# Patient Record
Sex: Male | Born: 1970 | Race: White | Hispanic: No | Marital: Married | Smoking: Former smoker
Health system: Southern US, Community
[De-identification: ages and names within clinical notes are randomized; demographics above are authoritative.]

## PROBLEM LIST (undated history)

## (undated) DIAGNOSIS — K219 Gastro-esophageal reflux disease without esophagitis: Secondary | ICD-10-CM

## (undated) DIAGNOSIS — N200 Calculus of kidney: Secondary | ICD-10-CM

## (undated) DIAGNOSIS — M898X9 Other specified disorders of bone, unspecified site: Secondary | ICD-10-CM

## (undated) DIAGNOSIS — R519 Headache, unspecified: Secondary | ICD-10-CM

## (undated) DIAGNOSIS — M5412 Radiculopathy, cervical region: Secondary | ICD-10-CM

## (undated) DIAGNOSIS — E119 Type 2 diabetes mellitus without complications: Secondary | ICD-10-CM

## (undated) DIAGNOSIS — R51 Headache: Secondary | ICD-10-CM

## (undated) DIAGNOSIS — M899 Disorder of bone, unspecified: Secondary | ICD-10-CM

## (undated) DIAGNOSIS — C9000 Multiple myeloma not having achieved remission: Secondary | ICD-10-CM

## (undated) HISTORY — DX: Disorder of bone, unspecified: M89.9

## (undated) HISTORY — PX: OTHER SURGICAL HISTORY: SHX169

## (undated) HISTORY — PX: BACK SURGERY: SHX140

## (undated) HISTORY — PX: EYE SURGERY: SHX253

## (undated) HISTORY — DX: Other specified disorders of bone, unspecified site: M89.8X9

## (undated) HISTORY — DX: Multiple myeloma not having achieved remission: C90.00

## (undated) HISTORY — PX: VASECTOMY: SHX75

## (undated) HISTORY — PX: ROTATOR CUFF REPAIR: SHX139

## (undated) HISTORY — DX: Radiculopathy, cervical region: M54.12

---

## 2003-02-20 ENCOUNTER — Emergency Department (HOSPITAL_COMMUNITY): Admission: AC | Admit: 2003-02-20 | Discharge: 2003-02-20 | Payer: Self-pay

## 2012-02-04 ENCOUNTER — Other Ambulatory Visit: Payer: Self-pay | Admitting: Neurosurgery

## 2012-02-04 DIAGNOSIS — M549 Dorsalgia, unspecified: Secondary | ICD-10-CM

## 2012-02-04 DIAGNOSIS — M5410 Radiculopathy, site unspecified: Secondary | ICD-10-CM

## 2012-02-09 ENCOUNTER — Ambulatory Visit
Admission: RE | Admit: 2012-02-09 | Discharge: 2012-02-09 | Disposition: A | Discharge status: Home / Self Care | Payer: BC Managed Care – PPO | Source: Ambulatory Visit | Attending: Neurosurgery | Admitting: Neurosurgery

## 2012-02-09 VITALS — BP 126/76 | HR 61 | Ht 72.0 in | Wt 205.0 lb

## 2012-02-09 DIAGNOSIS — M549 Dorsalgia, unspecified: Secondary | ICD-10-CM

## 2012-02-09 DIAGNOSIS — M5410 Radiculopathy, site unspecified: Secondary | ICD-10-CM

## 2012-02-09 MED ORDER — DIAZEPAM 5 MG PO TABS
10.0000 mg | ORAL_TABLET | Freq: Once | ORAL | Status: AC
  Administered 2012-02-09: 10 mg via ORAL

## 2012-02-09 MED ORDER — IOHEXOL 180 MG/ML  SOLN
15.0000 mL | Freq: Once | INTRAMUSCULAR | Status: AC | PRN
  Administered 2012-02-09: 15 mL via INTRATHECAL

## 2012-02-09 NOTE — Progress Notes (Signed)
Patient resting quietly on stomach on stretcher in nursing station with wife at bedside.  Denies any pain at present.  jkl

## 2012-02-26 ENCOUNTER — Other Ambulatory Visit: Payer: Self-pay | Admitting: Neurosurgery

## 2012-04-20 ENCOUNTER — Encounter (HOSPITAL_COMMUNITY)
Admission: RE | Admit: 2012-04-20 | Discharge: 2012-04-20 | Disposition: A | Discharge status: Home / Self Care | Payer: BC Managed Care – PPO | Source: Ambulatory Visit | Attending: Neurosurgery | Admitting: Neurosurgery

## 2012-04-20 ENCOUNTER — Encounter (HOSPITAL_COMMUNITY)
Admission: RE | Admit: 2012-04-20 | Discharge: 2012-04-20 | Disposition: A | Discharge status: Home / Self Care | Payer: BC Managed Care – PPO | Source: Ambulatory Visit | Attending: Anesthesiology | Admitting: Anesthesiology

## 2012-04-20 ENCOUNTER — Encounter (HOSPITAL_COMMUNITY): Payer: Self-pay | Admitting: Pharmacy Technician

## 2012-04-20 ENCOUNTER — Encounter (HOSPITAL_COMMUNITY): Payer: Self-pay

## 2012-04-20 HISTORY — DX: Calculus of kidney: N20.0

## 2012-04-20 HISTORY — DX: Gastro-esophageal reflux disease without esophagitis: K21.9

## 2012-04-20 HISTORY — DX: Type 2 diabetes mellitus without complications: E11.9

## 2012-04-20 LAB — BASIC METABOLIC PANEL
BUN: 14 mg/dL (ref 6–23)
CO2: 27 mEq/L (ref 19–32)
Calcium: 9.8 mg/dL (ref 8.4–10.5)
Creatinine, Ser: 0.84 mg/dL (ref 0.50–1.35)
GFR calc non Af Amer: >90 mL/min (ref >90)
Glucose, Bld: 101 mg/dL — ABNORMAL HIGH (ref 70–99)
Sodium: 137 mEq/L (ref 135–145)

## 2012-04-20 LAB — CBC
MCH: 29.4 pg (ref 26.0–34.0)
MCHC: 34.8 g/dL (ref 30.0–36.0)
MCV: 84.5 fL (ref 78.0–100.0)
Platelets: 239 10*3/uL (ref 150–400)
RDW: 13.3 % (ref 11.5–15.5)

## 2012-04-20 LAB — TYPE AND SCREEN: ABO/RH(D): B POS

## 2012-04-20 LAB — SURGICAL PCR SCREEN: MRSA, PCR: NEGATIVE

## 2012-04-20 NOTE — Pre-Procedure Instructions (Signed)
[address]  04/20/2012   Your procedure is scheduled on:  Tuesday, December 3rd.  Report to [facility] at 5 :30 AM.  Call this number if you have problems the morning of surgery: [phone]   Remember:                              Nothing to eat or drink after Midnight.    Take these medicines the morning of surgery with A SIP OF WATER: Gabapentin (Neurotin).  May take Oxycodone (Roxicodone) if needed.   Stop taking Aspirin and Meloxicam on November 26th     Do not .wear jewelry, make-up or nail polish.  Do not wear lotions, powders, or perfumes. You may wear deodorant.  Do not shave 48 hours prior to surgery. Men may shave face and neck.  Do not bring valuables to the hospital.  Contacts, dentures or bridgework may not be worn into surgery.  Leave suitcase in the car. After surgery it may be brought to your room.  For patients admitted to the hospital, checkout time is 11:00 AM the day of discharge.   Patients discharged the day of surgery will not be allowed to drive home.  Name and phone number of your driver: NA    Special Instructions: Shower using CHG 2 nights before surgery and the night before surgery.  If you shower the day of surgery use CHG.  Use special wash - you have one bottle of CHG for all showers.  You should use approximately 1/3 of the bottle for each shower.     Please read over the following fact sheets that you were given: Pain Booklet, Coughing and Deep Breathing, Blood Transfusion Information and Surgical Site Infection Prevention
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
## 2012-04-22 NOTE — Consult Note (Signed)
Anesthesia chart review: Patient is a 41 year old male posted for L2-3, L3-4 posterior lumbar fusion on 05/04/12 by Dr. Jeral Fruit.  History includes former smoker, GERD, diabetes mellitus type 2, kidney stones.  EKG from 04/20/2012 showed normal sinus rhythm, cannot rule out anterior infarct, age undetermined. Currently there no comparison EKGs available.  Chest x-ray on 04/20/2012 showed no acute cardiopulmonary abnormalities.  Preoperative labs noted.  No CV symptoms were documented at his PAT appointment. Anticipate that if he remains asymptomatic from a CV standpoint then he can proceed as planned.  Shonna Chock, PA-C

## 2012-04-23 NOTE — Progress Notes (Signed)
Message left with Shanda Bumps Dr Cassandria Santee scheduler to inform Dr Jeral Fruit of need for surgical orders.

## 2012-05-03 NOTE — Progress Notes (Signed)
Called and left message with Shanda Bumps at Dr. Cassandria Santee office and request that he sign orders.

## 2012-05-04 ENCOUNTER — Inpatient Hospital Stay (HOSPITAL_COMMUNITY)
Admission: RE | Admit: 2012-05-04 | Discharge: 2012-05-07 | DRG: 756 | Disposition: A | Discharge status: Home / Self Care | Payer: BC Managed Care – PPO | Source: Ambulatory Visit | Attending: Neurosurgery | Admitting: Neurosurgery

## 2012-05-04 ENCOUNTER — Encounter (HOSPITAL_COMMUNITY)
Admission: RE | Disposition: A | Discharge status: Home / Self Care | Payer: Self-pay | Source: Ambulatory Visit | Attending: Neurosurgery

## 2012-05-04 ENCOUNTER — Ambulatory Visit (HOSPITAL_COMMUNITY): Payer: BC Managed Care – PPO

## 2012-05-04 ENCOUNTER — Encounter (HOSPITAL_COMMUNITY): Payer: Self-pay | Admitting: Vascular Surgery

## 2012-05-04 ENCOUNTER — Ambulatory Visit (HOSPITAL_COMMUNITY): Payer: BC Managed Care – PPO | Admitting: Vascular Surgery

## 2012-05-04 DIAGNOSIS — Z01810 Encounter for preprocedural cardiovascular examination: Secondary | ICD-10-CM

## 2012-05-04 DIAGNOSIS — Z01811 Encounter for preprocedural respiratory examination: Secondary | ICD-10-CM

## 2012-05-04 DIAGNOSIS — Z01812 Encounter for preprocedural laboratory examination: Secondary | ICD-10-CM

## 2012-05-04 DIAGNOSIS — Z7982 Long term (current) use of aspirin: Secondary | ICD-10-CM

## 2012-05-04 DIAGNOSIS — M48061 Spinal stenosis, lumbar region without neurogenic claudication: Principal | ICD-10-CM | POA: Diagnosis present

## 2012-05-04 DIAGNOSIS — E119 Type 2 diabetes mellitus without complications: Secondary | ICD-10-CM | POA: Diagnosis present

## 2012-05-04 DIAGNOSIS — Z79899 Other long term (current) drug therapy: Secondary | ICD-10-CM

## 2012-05-04 DIAGNOSIS — K219 Gastro-esophageal reflux disease without esophagitis: Secondary | ICD-10-CM | POA: Diagnosis present

## 2012-05-04 DIAGNOSIS — Z87891 Personal history of nicotine dependence: Secondary | ICD-10-CM

## 2012-05-04 HISTORY — PX: LUMBAR FUSION: SHX111

## 2012-05-04 LAB — GLUCOSE, CAPILLARY
Glucose-Capillary: 113 mg/dL — ABNORMAL HIGH (ref 70–99)
Glucose-Capillary: 119 mg/dL — ABNORMAL HIGH (ref 70–99)

## 2012-05-04 SURGERY — POSTERIOR LUMBAR FUSION 2 LEVEL
Anesthesia: General | Site: Back | Wound class: Clean

## 2012-05-04 MED ORDER — CEFAZOLIN SODIUM-DEXTROSE 2-3 GM-% IV SOLR
INTRAVENOUS | Status: AC
  Filled 2012-05-04: qty 50

## 2012-05-04 MED ORDER — ACETAMINOPHEN 325 MG PO TABS
650.0000 mg | ORAL_TABLET | ORAL | Status: DC | PRN
  Administered 2012-05-06: 650 mg via ORAL
  Filled 2012-05-04: qty 2

## 2012-05-04 MED ORDER — LIDOCAINE HCL 4 % MT SOLN
OROMUCOSAL | Status: DC | PRN
  Administered 2012-05-04: 4 mL via TOPICAL

## 2012-05-04 MED ORDER — NALOXONE HCL 0.4 MG/ML IJ SOLN: 0.4000 mg | INTRAMUSCULAR | Status: DC | PRN

## 2012-05-04 MED ORDER — DIAZEPAM 5 MG PO TABS
5.0000 mg | ORAL_TABLET | Freq: Four times a day (QID) | ORAL | Status: DC | PRN
  Administered 2012-05-04 – 2012-05-06 (×7): 5 mg via ORAL
  Filled 2012-05-04 (×7): qty 1

## 2012-05-04 MED ORDER — DOCUSATE SODIUM 100 MG PO CAPS
100.0000 mg | ORAL_CAPSULE | Freq: Two times a day (BID) | ORAL | Status: DC
  Administered 2012-05-04 – 2012-05-07 (×6): 100 mg via ORAL
  Filled 2012-05-04 (×6): qty 1

## 2012-05-04 MED ORDER — 0.9 % SODIUM CHLORIDE (POUR BTL) OPTIME
TOPICAL | Status: DC | PRN
  Administered 2012-05-04: 1000 mL

## 2012-05-04 MED ORDER — SODIUM CHLORIDE 0.9 % IV SOLN: 250.0000 mL | INTRAVENOUS | Status: DC

## 2012-05-04 MED ORDER — GLYCOPYRROLATE 0.2 MG/ML IJ SOLN
INTRAMUSCULAR | Status: DC | PRN
  Administered 2012-05-04: 0.6 mg via INTRAVENOUS

## 2012-05-04 MED ORDER — DIAZEPAM 5 MG/ML IJ SOLN
INTRAMUSCULAR | Status: AC
  Filled 2012-05-04: qty 2

## 2012-05-04 MED ORDER — CEFAZOLIN SODIUM-DEXTROSE 2-3 GM-% IV SOLR
2.0000 g | Freq: Once | INTRAVENOUS | Status: AC
  Administered 2012-05-04: 2 g via INTRAVENOUS

## 2012-05-04 MED ORDER — OXYCODONE HCL 5 MG PO TABS
ORAL_TABLET | ORAL | Status: AC
  Filled 2012-05-04: qty 1

## 2012-05-04 MED ORDER — LIDOCAINE HCL (CARDIAC) 20 MG/ML IV SOLN
INTRAVENOUS | Status: DC | PRN
  Administered 2012-05-04: 80 mg via INTRAVENOUS

## 2012-05-04 MED ORDER — INSULIN ASPART 100 UNIT/ML ~~LOC~~ SOLN
0.0000 [IU] | Freq: Three times a day (TID) | SUBCUTANEOUS | Status: DC
Start: 2012-05-04 — End: 2012-05-07
  Administered 2012-05-04: 3 [IU] via SUBCUTANEOUS
  Administered 2012-05-05 (×2): 2 [IU] via SUBCUTANEOUS
  Administered 2012-05-05: 3 [IU] via SUBCUTANEOUS
  Administered 2012-05-06: 2 [IU] via SUBCUTANEOUS
  Administered 2012-05-06: 3 [IU] via SUBCUTANEOUS
  Administered 2012-05-07: 2 [IU] via SUBCUTANEOUS

## 2012-05-04 MED ORDER — SUFENTANIL CITRATE 50 MCG/ML IV SOLN
INTRAVENOUS | Status: DC | PRN
  Administered 2012-05-04: 10 ug via INTRAVENOUS
  Administered 2012-05-04: 20 ug via INTRAVENOUS
  Administered 2012-05-04 (×3): 10 ug via INTRAVENOUS
  Administered 2012-05-04: 5 ug via INTRAVENOUS

## 2012-05-04 MED ORDER — HYDROMORPHONE HCL PF 1 MG/ML IJ SOLN
INTRAMUSCULAR | Status: AC
  Filled 2012-05-04: qty 1

## 2012-05-04 MED ORDER — SODIUM CHLORIDE 0.9 % IV SOLN
INTRAVENOUS | Status: DC
Start: 2012-05-04 — End: 2012-05-06
  Administered 2012-05-04 – 2012-05-05 (×2): via INTRAVENOUS

## 2012-05-04 MED ORDER — THROMBIN 20000 UNITS EX SOLR
CUTANEOUS | Status: DC | PRN
  Administered 2012-05-04: 07:00:00 via TOPICAL

## 2012-05-04 MED ORDER — OXYCODONE HCL 5 MG/5ML PO SOLN: 5.0000 mg | Freq: Once | ORAL | Status: AC | PRN

## 2012-05-04 MED ORDER — ACETAMINOPHEN 650 MG RE SUPP: 650.0000 mg | RECTAL | Status: DC | PRN

## 2012-05-04 MED ORDER — DIAZEPAM 5 MG/ML IJ SOLN
5.0000 mg | Freq: Four times a day (QID) | INTRAMUSCULAR | Status: DC | PRN
  Administered 2012-05-04 (×2): 2.5 mg via INTRAVENOUS

## 2012-05-04 MED ORDER — ONDANSETRON HCL 4 MG/2ML IJ SOLN
4.0000 mg | Freq: Four times a day (QID) | INTRAMUSCULAR | Status: DC | PRN
Start: 2012-05-04 — End: 2012-05-06

## 2012-05-04 MED ORDER — PROMETHAZINE HCL 25 MG/ML IJ SOLN: 6.2500 mg | INTRAMUSCULAR | Status: DC | PRN

## 2012-05-04 MED ORDER — MIDAZOLAM HCL 5 MG/5ML IJ SOLN
INTRAMUSCULAR | Status: DC | PRN
  Administered 2012-05-04: 2 mg via INTRAVENOUS

## 2012-05-04 MED ORDER — PROPOFOL 10 MG/ML IV BOLUS
INTRAVENOUS | Status: DC | PRN
  Administered 2012-05-04: 200 mg via INTRAVENOUS

## 2012-05-04 MED ORDER — SODIUM CHLORIDE 0.9 % IJ SOLN
3.0000 mL | Freq: Two times a day (BID) | INTRAMUSCULAR | Status: DC
  Administered 2012-05-04 – 2012-05-06 (×4): 3 mL via INTRAVENOUS

## 2012-05-04 MED ORDER — MEPERIDINE HCL 25 MG/ML IJ SOLN: 6.2500 mg | INTRAMUSCULAR | Status: DC | PRN

## 2012-05-04 MED ORDER — OXYCODONE-ACETAMINOPHEN 5-325 MG PO TABS
1.0000 | ORAL_TABLET | ORAL | Status: DC | PRN
  Administered 2012-05-04 – 2012-05-06 (×7): 2 via ORAL
  Filled 2012-05-04 (×8): qty 2

## 2012-05-04 MED ORDER — DIPHENHYDRAMINE HCL 12.5 MG/5ML PO ELIX: 12.5000 mg | ORAL_SOLUTION | Freq: Four times a day (QID) | ORAL | Status: DC | PRN

## 2012-05-04 MED ORDER — ACETAMINOPHEN 10 MG/ML IV SOLN
1000.0000 mg | Freq: Once | INTRAVENOUS | Status: AC
  Administered 2012-05-04: 1000 mg via INTRAVENOUS

## 2012-05-04 MED ORDER — CEFAZOLIN SODIUM 1-5 GM-% IV SOLN
1.0000 g | Freq: Three times a day (TID) | INTRAVENOUS | Status: AC
  Administered 2012-05-04 – 2012-05-05 (×2): 1 g via INTRAVENOUS
  Filled 2012-05-04 (×2): qty 50

## 2012-05-04 MED ORDER — PHENOL 1.4 % MT LIQD: 1.0000 | OROMUCOSAL | Status: DC | PRN

## 2012-05-04 MED ORDER — DIAZEPAM 5 MG PO TABS
ORAL_TABLET | ORAL | Status: AC
  Filled 2012-05-04: qty 1

## 2012-05-04 MED ORDER — SODIUM CHLORIDE 0.9 % IJ SOLN: 9.0000 mL | INTRAMUSCULAR | Status: DC | PRN

## 2012-05-04 MED ORDER — DIPHENHYDRAMINE HCL 50 MG/ML IJ SOLN: 12.5000 mg | Freq: Four times a day (QID) | INTRAMUSCULAR | Status: DC | PRN

## 2012-05-04 MED ORDER — ZOLPIDEM TARTRATE 5 MG PO TABS
10.0000 mg | ORAL_TABLET | Freq: Every evening | ORAL | Status: DC | PRN
  Filled 2012-05-04: qty 2

## 2012-05-04 MED ORDER — MORPHINE SULFATE (PF) 1 MG/ML IV SOLN
INTRAVENOUS | Status: DC
  Administered 2012-05-04: 12:00:00 via INTRAVENOUS
  Administered 2012-05-04: 21 mg via INTRAVENOUS

## 2012-05-04 MED ORDER — VECURONIUM BROMIDE 10 MG IV SOLR
INTRAVENOUS | Status: DC | PRN
  Administered 2012-05-04: 4 mg via INTRAVENOUS
  Administered 2012-05-04 (×2): 2 mg via INTRAVENOUS

## 2012-05-04 MED ORDER — ARTIFICIAL TEARS OP OINT
TOPICAL_OINTMENT | OPHTHALMIC | Status: DC | PRN
  Administered 2012-05-04: 1 via OPHTHALMIC

## 2012-05-04 MED ORDER — HYDROMORPHONE 0.3 MG/ML IV SOLN
INTRAVENOUS | Status: DC
  Administered 2012-05-04: 0.9 mg via INTRAVENOUS
  Administered 2012-05-04: 14:00:00 via INTRAVENOUS
  Administered 2012-05-04: 5.7 mg via INTRAVENOUS
  Administered 2012-05-04: 1.5 mg via INTRAVENOUS
  Administered 2012-05-05: 3.3 mg via INTRAVENOUS
  Administered 2012-05-05: 2.4 mg via INTRAVENOUS
  Administered 2012-05-05 (×2): 3.6 mg via INTRAVENOUS
  Administered 2012-05-05: 3.57 mg via INTRAVENOUS
  Administered 2012-05-06: 02:00:00 via INTRAVENOUS
  Administered 2012-05-06: 3.3 mg via INTRAVENOUS
  Administered 2012-05-06: 4.8 mg via INTRAVENOUS
  Administered 2012-05-06: 1.5 mg via INTRAVENOUS
  Filled 2012-05-04 (×4): qty 25

## 2012-05-04 MED ORDER — BUPIVACAINE LIPOSOME 1.3 % IJ SUSP
20.0000 mL | INTRAMUSCULAR | Status: AC
  Administered 2012-05-04: 20 mL
  Filled 2012-05-04: qty 20

## 2012-05-04 MED ORDER — LACTATED RINGERS IV SOLN
INTRAVENOUS | Status: DC | PRN
  Administered 2012-05-04 (×3): via INTRAVENOUS

## 2012-05-04 MED ORDER — MORPHINE SULFATE (PF) 1 MG/ML IV SOLN
INTRAVENOUS | Status: AC
  Filled 2012-05-04: qty 25

## 2012-05-04 MED ORDER — HYDROMORPHONE 0.3 MG/ML IV SOLN
INTRAVENOUS | Status: AC
  Filled 2012-05-04: qty 25

## 2012-05-04 MED ORDER — MENTHOL 3 MG MT LOZG
1.0000 | LOZENGE | OROMUCOSAL | Status: DC | PRN
  Filled 2012-05-04: qty 9

## 2012-05-04 MED ORDER — ONDANSETRON HCL 4 MG/2ML IJ SOLN
INTRAMUSCULAR | Status: DC | PRN
  Administered 2012-05-04: 4 mg via INTRAVENOUS

## 2012-05-04 MED ORDER — ONDANSETRON HCL 4 MG/2ML IJ SOLN: 4.0000 mg | INTRAMUSCULAR | Status: DC | PRN

## 2012-05-04 MED ORDER — METFORMIN HCL ER 500 MG PO TB24
500.0000 mg | ORAL_TABLET | Freq: Four times a day (QID) | ORAL | Status: DC
  Administered 2012-05-04 – 2012-05-07 (×10): 500 mg via ORAL
  Filled 2012-05-04 (×13): qty 1

## 2012-05-04 MED ORDER — OXYCODONE HCL 5 MG PO TABS
5.0000 mg | ORAL_TABLET | Freq: Once | ORAL | Status: AC | PRN
  Administered 2012-05-04: 5 mg via ORAL

## 2012-05-04 MED ORDER — ACETAMINOPHEN 10 MG/ML IV SOLN
INTRAVENOUS | Status: AC
  Filled 2012-05-04: qty 100

## 2012-05-04 MED ORDER — ONDANSETRON HCL 4 MG/2ML IJ SOLN: 4.0000 mg | Freq: Four times a day (QID) | INTRAMUSCULAR | Status: DC | PRN

## 2012-05-04 MED ORDER — ROCURONIUM BROMIDE 100 MG/10ML IV SOLN
INTRAVENOUS | Status: DC | PRN
  Administered 2012-05-04: 50 mg via INTRAVENOUS

## 2012-05-04 MED ORDER — CEFAZOLIN SODIUM-DEXTROSE 2-3 GM-% IV SOLR
INTRAVENOUS | Status: AC
  Administered 2012-05-04: 2 g via INTRAVENOUS
  Filled 2012-05-04: qty 50

## 2012-05-04 MED ORDER — SODIUM CHLORIDE 0.9 % IJ SOLN: 3.0000 mL | INTRAMUSCULAR | Status: DC | PRN

## 2012-05-04 MED ORDER — HYDROMORPHONE HCL PF 1 MG/ML IJ SOLN
0.2500 mg | INTRAMUSCULAR | Status: DC | PRN
  Administered 2012-05-04 (×4): 0.5 mg via INTRAVENOUS

## 2012-05-04 MED ORDER — HEMOSTATIC AGENTS (NO CHARGE) OPTIME
TOPICAL | Status: DC | PRN
  Administered 2012-05-04: 1 via TOPICAL

## 2012-05-04 MED ORDER — NEOSTIGMINE METHYLSULFATE 1 MG/ML IJ SOLN
INTRAMUSCULAR | Status: DC | PRN
  Administered 2012-05-04: 3 mg via INTRAVENOUS

## 2012-05-04 MED ORDER — GABAPENTIN 300 MG PO CAPS
300.0000 mg | ORAL_CAPSULE | Freq: Three times a day (TID) | ORAL | Status: DC
  Administered 2012-05-04 – 2012-05-07 (×9): 300 mg via ORAL
  Filled 2012-05-04 (×12): qty 1

## 2012-05-04 SURGICAL SUPPLY — 72 items
BENZOIN TINCTURE PRP APPL 2/3 (GAUZE/BANDAGES/DRESSINGS) ×2 IMPLANT
BLADE SURG ROTATE 9660 (MISCELLANEOUS) IMPLANT
BONE EQUIVA 10CC (Bone Implant) ×2 IMPLANT
BUR ACORN 6.0 (BURR) ×2 IMPLANT
BUR MATCHSTICK NEURO 3.0 LAGG (BURR) ×2 IMPLANT
CANISTER SUCTION 2500CC (MISCELLANEOUS) ×2 IMPLANT
CAP REVERE LOCKING (Cap) ×12 IMPLANT
CLOTH BEACON ORANGE TIMEOUT ST (SAFETY) ×2 IMPLANT
CONN CROSSLINK REV 6.35 48-60 (Connector) ×2 IMPLANT
CONNECTOR CRSLNK REV6.35 48-60 (Connector) ×1 IMPLANT
CONT SPEC 4OZ CLIKSEAL STRL BL (MISCELLANEOUS) ×2 IMPLANT
COVER BACK TABLE 24X17X13 BIG (DRAPES) IMPLANT
COVER TABLE BACK 60X90 (DRAPES) ×2 IMPLANT
DERMABOND ADVANCED (GAUZE/BANDAGES/DRESSINGS) ×2
DERMABOND ADVANCED .7 DNX12 (GAUZE/BANDAGES/DRESSINGS) ×2 IMPLANT
DRAPE C-ARM 42X72 X-RAY (DRAPES) ×4 IMPLANT
DRAPE LAPAROTOMY 100X72X124 (DRAPES) ×2 IMPLANT
DRAPE POUCH INSTRU U-SHP 10X18 (DRAPES) ×2 IMPLANT
DRSG PAD ABDOMINAL 8X10 ST (GAUZE/BANDAGES/DRESSINGS) ×2 IMPLANT
DURAPREP 26ML APPLICATOR (WOUND CARE) ×2 IMPLANT
ELECT BLADE 4.0 EZ CLEAN MEGAD (MISCELLANEOUS) ×2
ELECT REM PT RETURN 9FT ADLT (ELECTROSURGICAL) ×2
ELECTRODE BLDE 4.0 EZ CLN MEGD (MISCELLANEOUS) ×1 IMPLANT
ELECTRODE REM PT RTRN 9FT ADLT (ELECTROSURGICAL) ×1 IMPLANT
EVACUATOR 1/8 PVC DRAIN (DRAIN) IMPLANT
EVACUATOR 3/16  PVC DRAIN (DRAIN) ×1
EVACUATOR 3/16 PVC DRAIN (DRAIN) ×1 IMPLANT
GAUZE SPONGE 4X4 16PLY XRAY LF (GAUZE/BANDAGES/DRESSINGS) ×2 IMPLANT
GLOVE BIO SURGEON STRL SZ 6.5 (GLOVE) ×4 IMPLANT
GLOVE BIOGEL M 8.0 STRL (GLOVE) ×4 IMPLANT
GLOVE BIOGEL PI IND STRL 7.0 (GLOVE) ×2 IMPLANT
GLOVE BIOGEL PI IND STRL 8 (GLOVE) ×1 IMPLANT
GLOVE BIOGEL PI INDICATOR 7.0 (GLOVE) ×2
GLOVE BIOGEL PI INDICATOR 8 (GLOVE) ×1
GLOVE ECLIPSE 7.5 STRL STRAW (GLOVE) ×8 IMPLANT
GLOVE EXAM NITRILE LRG STRL (GLOVE) IMPLANT
GLOVE EXAM NITRILE MD LF STRL (GLOVE) ×4 IMPLANT
GLOVE EXAM NITRILE XL STR (GLOVE) IMPLANT
GLOVE EXAM NITRILE XS STR PU (GLOVE) IMPLANT
GOWN BRE IMP SLV AUR LG STRL (GOWN DISPOSABLE) ×8 IMPLANT
GOWN BRE IMP SLV AUR XL STRL (GOWN DISPOSABLE) ×2 IMPLANT
GOWN STRL REIN 2XL LVL4 (GOWN DISPOSABLE) IMPLANT
KIT BASIN OR (CUSTOM PROCEDURE TRAY) ×2 IMPLANT
KIT ROOM TURNOVER OR (KITS) ×2 IMPLANT
MILL MEDIUM DISP (BLADE) ×2 IMPLANT
NEEDLE HYPO 18GX1.5 BLUNT FILL (NEEDLE) IMPLANT
NEEDLE HYPO 21X1.5 SAFETY (NEEDLE) ×2 IMPLANT
NEEDLE HYPO 25X1 1.5 SAFETY (NEEDLE) ×2 IMPLANT
NEEDLE SPNL 22GX3.5 QUINCKE BK (NEEDLE) ×4 IMPLANT
NS IRRIG 1000ML POUR BTL (IV SOLUTION) ×2 IMPLANT
PACK LAMINECTOMY NEURO (CUSTOM PROCEDURE TRAY) ×2 IMPLANT
PAD ARMBOARD 7.5X6 YLW CONV (MISCELLANEOUS) ×6 IMPLANT
PATTIES SURGICAL .5 X1 (DISPOSABLE) ×2 IMPLANT
PATTIES SURGICAL .5 X3 (DISPOSABLE) IMPLANT
ROD REVERE 6.35X85MM (Rod) ×4 IMPLANT
SCREW REVERE 5.5X45 (Screw) ×12 IMPLANT
SPONGE GAUZE 4X4 12PLY (GAUZE/BANDAGES/DRESSINGS) ×2 IMPLANT
SPONGE LAP 4X18 X RAY DECT (DISPOSABLE) ×2 IMPLANT
SPONGE NEURO XRAY DETECT 1X3 (DISPOSABLE) IMPLANT
SPONGE SURGIFOAM ABS GEL 100 (HEMOSTASIS) ×2 IMPLANT
STRIP CLOSURE SKIN 1/2X4 (GAUZE/BANDAGES/DRESSINGS) ×4 IMPLANT
SUT VIC AB 1 CT1 18XBRD ANBCTR (SUTURE) ×2 IMPLANT
SUT VIC AB 1 CT1 8-18 (SUTURE) ×2
SUT VIC AB 2-0 CP2 18 (SUTURE) ×4 IMPLANT
SUT VIC AB 3-0 SH 8-18 (SUTURE) ×2 IMPLANT
SYR 20CC LL (SYRINGE) ×2 IMPLANT
SYR 20ML ECCENTRIC (SYRINGE) ×2 IMPLANT
SYR 5ML LL (SYRINGE) ×2 IMPLANT
TOWEL OR 17X24 6PK STRL BLUE (TOWEL DISPOSABLE) ×2 IMPLANT
TOWEL OR 17X26 10 PK STRL BLUE (TOWEL DISPOSABLE) ×2 IMPLANT
TRAY FOLEY CATH 14FRSI W/METER (CATHETERS) ×2 IMPLANT
WATER STERILE IRR 1000ML POUR (IV SOLUTION) ×2 IMPLANT

## 2012-05-04 NOTE — Clinical Social Work Note (Signed)
Clinical Social Work   CSW received consult for SNF. CSW reviewed chart. Awaiting PT evals for discharge recommendations. CSW will assess for SNF, if appropriate. CSW will continue to follow. Please call with any urgent needs.   Dede Query, MSW, [facility]ors [phone]

## 2012-05-04 NOTE — Anesthesia Procedure Notes (Signed)
Procedure Name: Intubation Date/Time: 05/04/2012 8:01 AM Performed by: Lovie Chol Pre-anesthesia Checklist: Patient identified, Emergency Drugs available, Patient being monitored, Suction available and Timeout performed Patient Re-evaluated:Patient Re-evaluated prior to inductionOxygen Delivery Method: Circle system utilized Preoxygenation: Pre-oxygenation with 100% oxygen Intubation Type: IV induction Ventilation: Mask ventilation without difficulty and Oral airway inserted - appropriate to patient size Laryngoscope Size: Miller and 2 Grade View: Grade I Tube type: Oral Tube size: 7.5 mm Number of attempts: 1 Airway Equipment and Method: Stylet and LTA kit utilized Placement Confirmation: ETT inserted through vocal cords under direct vision,  positive ETCO2 and breath sounds checked- equal and bilateral Secured at: 23 (teeth) cm Tube secured with: Tape Dental Injury: Teeth and Oropharynx as per pre-operative assessment
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
## 2012-05-04 NOTE — Progress Notes (Signed)
Op note [phone]

## 2012-05-04 NOTE — Anesthesia Preprocedure Evaluation (Addendum)
Anesthesia Evaluation  Patient identified by MRN, date of birth, ID band Patient awake    Reviewed: Allergy & Precautions, H&P , NPO status , Patient's Chart, lab work & pertinent test results  History of Anesthesia Complications Negative for: history of anesthetic complications  Airway Mallampati: I TM Distance: >3 FB Neck ROM: Full    Dental  (+) Teeth Intact, Poor Dentition and Dental Advisory Given   Pulmonary former smoker,  breath sounds clear to auscultation        Cardiovascular Rhythm:Regular Rate:Normal     Neuro/Psych    GI/Hepatic GERD-  Medicated and Controlled,  Endo/Other  diabetes, Well Controlled, Type 2, Oral Hypoglycemic Agents  Renal/GU      Musculoskeletal   Abdominal   Peds  Hematology   Anesthesia Other Findings   Reproductive/Obstetrics                           Anesthesia Physical Anesthesia Plan  ASA: II  Anesthesia Plan: General   Post-op Pain Management:    Induction: Intravenous  Airway Management Planned: Oral ETT  Additional Equipment:   Intra-op Plan:   Post-operative Plan: Extubation in OR  Informed Consent: I have reviewed the patients History and Physical, chart, labs and discussed the procedure including the risks, benefits and alternatives for the proposed anesthesia with the patient or authorized representative who has indicated his/her understanding and acceptance.   Dental advisory given  Plan Discussed with: CRNA, Anesthesiologist and Surgeon  Anesthesia Plan Comments:         Anesthesia Quick Evaluation

## 2012-05-04 NOTE — Transfer of Care (Signed)
Immediate Anesthesia Transfer of Care Note  Patient: Dylan Benson  Procedure(s) Performed: Procedure(s) (LRB) with comments: POSTERIOR LUMBAR FUSION 2 LEVEL (N/A) - Lumbar two-three,lumbar three-four Fusion/cages/pedicle screws/posterolateral arthrodesis/cellsaver  Patient Location: PACU  Anesthesia Type:General  Level of Consciousness: awake, alert  and oriented  Airway & Oxygen Therapy: Patient Spontanous Breathing and Patient connected to face mask oxygen  Post-op Assessment: Report given to PACU RN and Post -op Vital signs reviewed and stable  Post vital signs: Reviewed and stable  Complications: No apparent anesthesia complications

## 2012-05-04 NOTE — Preoperative (Signed)
Beta Blockers   Reason not to administer Beta Blockers:Not Applicable 

## 2012-05-04 NOTE — Progress Notes (Signed)
Doing well, c/o incisional pain, no leg pain or weakness

## 2012-05-04 NOTE — Anesthesia Postprocedure Evaluation (Signed)
  Anesthesia Post-op Note  Patient: Dylan Benson  Procedure(s) Performed: Procedure(s) (LRB) with comments: POSTERIOR LUMBAR FUSION 2 LEVEL (N/A) - Lumbar two-three,lumbar three-four Fusion/cages/pedicle screws/posterolateral arthrodesis/cellsaver  Patient Location: PACU  Anesthesia Type:General  Level of Consciousness: awake  Airway and Oxygen Therapy: Patient Spontanous Breathing  Post-op Pain: mild  Post-op Assessment: Post-op Vital signs reviewed  Post-op Vital Signs: stable  Complications: No apparent anesthesia complications

## 2012-05-05 ENCOUNTER — Encounter (HOSPITAL_COMMUNITY): Payer: Self-pay | Admitting: *Deleted

## 2012-05-05 LAB — GLUCOSE, CAPILLARY
Glucose-Capillary: 156 mg/dL — ABNORMAL HIGH (ref 70–99)
Glucose-Capillary: 125 mg/dL — ABNORMAL HIGH (ref 70–99)
Glucose-Capillary: 182 mg/dL — ABNORMAL HIGH (ref 70–99)
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
## 2012-05-05 NOTE — Evaluation (Signed)
Physical Therapy Evaluation Patient Details Name: Dylan Benson MRN: [id number] DOB: Aug 24, 1970 Today's Date: 05/05/2012 Time: 4098-1191 PT Time Calculation (min): 42 min  PT Assessment / Plan / Recommendation Clinical Impression  Pt s/p PLF x 2 levels presenting with expected pain but is motivated. Patient progressing with mobility well. Anticpate patient to be safe for d/c home with spouse and use of RW when approved by MD.    PT Assessment  Patient needs continued PT services    Follow Up Recommendations  No PT follow up;Supervision/Assistance - 24 hour    Does the patient have the potential to tolerate intense rehabilitation      Barriers to Discharge None      Equipment Recommendations  Rolling walker with 5" wheels;3 in 1 bedside comode    Recommendations for Other Services     Frequency Min 5X/week    Precautions / Restrictions Precautions Precautions: Back Precaution Booklet Issued: Yes (comment) Precaution Comments: pt with verbal confirmation of understanding Required Braces or Orthoses: Spinal Brace Spinal Brace: Lumbar corset Restrictions Weight Bearing Restrictions: No   Pertinent Vitals/Pain 7/10 at surgical site       Mobility  Bed Mobility Bed Mobility: Rolling Right;Right Sidelying to Sit;Sitting - Scoot to [place] of Bed Rolling Right: 4: Min assist Right Sidelying to Sit: 4: Min assist;HOB flat Sitting - Scoot to [place] of Bed: 5: Supervision Details for Bed Mobility Assistance: verbal cues for technique, minA at trunk to complete roll and initiate transfer into sitting Transfers Transfers: Sit to Stand;Stand to Sit Sit to Stand: 4: Min guard;From bed Stand to Sit: 4: Min guard;To chair/3-in-1;With armrests Details for Transfer Assistance: increased time, good compliance with back precautions Ambulation/Gait Ambulation/Gait Assistance: 4: Min guard Ambulation Distance (Feet): 100 Feet Assistive device: Rolling walker Ambulation/Gait Assistance  Details: cautious, short steps, increased UE WBing, v/c's to limit trunk flexion with onset of fatigue. pt with improved stride length towards end of ambulation Gait Pattern: Step-to pattern;Decreased stride length Gait velocity: slow Stairs: No    Shoulder Instructions     Exercises     PT Diagnosis: Difficulty walking  PT Problem List: Decreased balance;Decreased mobility PT Treatment Interventions: Gait training;Therapeutic activities;Therapeutic exercise;Functional mobility training;Stair training   PT Goals Acute Rehab PT Goals PT Goal Formulation: With patient Time For Goal Achievement: 05/12/12 Potential to Achieve Goals: Good Pt will Roll Supine to Right Side: with modified independence PT Goal: Rolling Supine to Right Side - Progress: Goal set today Pt will go Supine/Side to Sit: with modified independence;with HOB 0 degrees PT Goal: Supine/Side to Sit - Progress: Goal set today Pt will go Sit to Stand: with modified independence;with upper extremity assist (up to RW.) PT Goal: Sit to Stand - Progress: Goal set today Pt will Ambulate: >150 feet;with modified independence;with rolling walker PT Goal: Ambulate - Progress: Goal set today Pt will Go Up / Down Stairs: 3-5 stairs;with supervision;with rail(s) PT Goal: Up/Down Stairs - Progress: Goal set today  Visit Information  Last PT Received On: 05/05/12 Assistance Needed: +1    Subjective Data  Subjective: Pt received supine in bed agreeable to PT. 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 Communication: No difficulties Dominant Hand: Right    Cognition  Overall Cognitive Status: Appears within functional limits for tasks assessed/performed Arousal/Alertness: Awake/alert Orientation Level: Oriented X4 / Intact Behavior During Session: [facility] for tasks performed    Extremity/Trunk Assessment Right Upper Extremity Assessment RUE ROM/Strength/Tone: Within functional levels Left Upper Extremity Assessment LUE ROM/Strength/Tone: Within functional levels Right Lower Extremity Assessment RLE ROM/Strength/Tone: Within functional levels Trunk Assessment Trunk Assessment: Normal   Balance    End of Session PT - End of Session Equipment Utilized During Treatment: Gait belt;Back brace Activity Tolerance: Patient tolerated treatment well Patient left: in chair;with call bell/phone within reach;with family/visitor present Nurse Communication: Mobility status  GP     Marcene Brawn 05/05/2012, 9:46 AM  Lewis Shock, PT, DPT Pager #: [phone] Office #: [phone]

## 2012-05-05 NOTE — Clinical Social Work Note (Signed)
Clinical Social Work   CSW received chart and discussed pt with RN during progressing. PT is not recommending any follow. RNCM is aware and following. CSW is signing off as no further needs identified. Please reconsult if a need arises prior to discharge.   Dede Query, MSW, [facility] [phone]

## 2012-05-05 NOTE — Evaluation (Signed)
Occupational Therapy Evaluation and Discharge Patient Details Name: Dylan Benson MRN: [id number] DOB: 02/10/1971 Today's Date: 05/05/2012 Time: 7829-5621 OT Time Calculation (min): 35 min  OT Assessment / Plan / Recommendation Clinical Impression  This 41 yo male s/p back fusion presents to acute OT with al education completed. Will D/C from acute OT.    OT Assessment  Patient does not need any further OT services    Follow Up Recommendations  No OT follow up       Equipment Recommendations  Rolling walker with 5" wheels;None recommended by OT          Precautions / Restrictions Precautions Precautions: Back Precaution Booklet Issued: Yes (comment) Precaution Comments: pt with verbal confirmation of understanding Required Braces or Orthoses: Spinal Brace Spinal Brace: Applied in sitting position Restrictions Weight Bearing Restrictions: No   Pertinent Vitals/Pain 6/10 at rest in back    ADL  Eating/Feeding: Simulated;Independent Where Assessed - Eating/Feeding: Chair Grooming: Simulated;Set up Where Assessed - Grooming: Supported sitting Upper Body Bathing: Simulated;Set up Where Assessed - Upper Body Bathing: Supported sitting Lower Body Bathing: Simulated;Min guard (crossing one leg over the other to get to feet) Where Assessed - Lower Body Bathing: Unsupported sit to stand Upper Body Dressing: Simulated;Set up Where Assessed - Upper Body Dressing: Unsupported sitting Lower Body Dressing: Simulated;Min guard (crossing one leg over the other to get to feet) Where Assessed - Lower Body Dressing: Unsupported sit to stand Toilet Transfer: Simulated;Min guard Toilet Transfer Method: Sit to Barista:  (from recliner with hands on thighs) Toileting - Clothing Manipulation and Hygiene: Simulated;Modified independent (with min guard A sit to stand and stand to sit) Where Assessed - Toileting Clothing Manipulation and Hygiene:  (sit to stand from  recliner) Transfers/Ambulation Related to ADLs: Min guard A ADL Comments: Went over with pt and wife how he would get in and out of walk in shower to 3n1 or in and out of garden tub shower combo to 3n1. Aware he needs to get into and out of shower with brace on. Advised him to use baby wipes for toileting hygiene. He is aware that if he is trying to do his own LBD he needs to have his brace on and seated on a supported surface for his back.        Visit Information  Last OT Received On: 05/05/12 Assistance Needed: +1    Subjective Data  Subjective: I believe I will lay back down   Prior Functioning     Home Living Lives With: Spouse Available Help at Discharge: Family;Available 24 hours/day Type of Home: House Home Access: Stairs to enter [employer] of Steps: 4 Entrance Stairs-Rails: Can reach both Home Layout: One level Bathroom Shower/Tub: Engineer, manufacturing systems: Standard Bathroom Accessibility: Yes How Accessible: Accessible via walker Home Adaptive Equipment: None Prior Function Level of Independence: Independent Able to Take Stairs?: Yes Driving: Yes 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 Communication: No difficulties Dominant Hand: Right            Cognition  Overall Cognitive Status: Appears within functional limits for tasks assessed/performed Arousal/Alertness: Awake/alert Orientation Level: Appears intact for tasks assessed Behavior During Session: Dylan Benson - [facility] for tasks performed    Extremity/Trunk Assessment Right Upper Extremity Assessment RUE ROM/Strength/Tone: Within functional levels Left Upper Extremity Assessment LUE ROM/Strength/Tone: Within functional levels Right Lower Extremity Assessment RLE ROM/Strength/Tone: Within functional levels Trunk Assessment Trunk Assessment: Normal     Mobility Bed Mobility Sit to Sidelying Right:  4: Min assist;HOB flat (No rail. A for legs  only) Transfers Transfers: Sit to Stand;Stand to Sit Sit to Stand: 4: Min guard;With upper extremity assist;With armrests;From chair/3-in-1 Stand to Sit: 4: Min guard;With upper extremity assist;To bed (with hands on thighs)              End of Session OT - End of Session Equipment Utilized During Treatment: Back brace (RW) Activity Tolerance: Patient tolerated treatment well Patient left: in bed;with call bell/phone within reach;with family/visitor present (wife)       Evette Georges [phone] 05/05/2012, 11:25 AM

## 2012-05-05 NOTE — Progress Notes (Signed)
Patient ID: Dylan Benson, male   DOB: 09-15-1970, 41 y.o.   MRN: [id number] Afebril, c/o incisional pain, no leg pain. Was able to ambulate with help. Continue pac

## 2012-05-06 LAB — GLUCOSE, CAPILLARY
Glucose-Capillary: 137 mg/dL — ABNORMAL HIGH (ref 70–99)
Glucose-Capillary: 116 mg/dL — ABNORMAL HIGH (ref 70–99)

## 2012-05-06 MED ORDER — OXYCODONE HCL 5 MG PO TABS
15.0000 mg | ORAL_TABLET | ORAL | Status: DC | PRN
  Administered 2012-05-06 – 2012-05-07 (×8): 15 mg via ORAL
  Filled 2012-05-06 (×8): qty 3

## 2012-05-06 MED ORDER — FAMOTIDINE 10 MG PO TABS
10.0000 mg | ORAL_TABLET | Freq: Two times a day (BID) | ORAL | Status: DC | PRN
  Administered 2012-05-06: 10 mg via ORAL
  Filled 2012-05-06: qty 1

## 2012-05-06 MED ORDER — BISACODYL 10 MG RE SUPP
10.0000 mg | Freq: Every day | RECTAL | Status: DC | PRN
  Administered 2012-05-06: 10 mg via RECTAL
  Filled 2012-05-06: qty 1

## 2012-05-06 MED FILL — Sodium Chloride Irrigation Soln 0.9%: Qty: 3000 | Status: AC

## 2012-05-06 MED FILL — Heparin Sodium (Porcine) Inj 1000 Unit/ML: INTRAMUSCULAR | Qty: 30 | Status: AC

## 2012-05-06 MED FILL — Sodium Chloride IV Soln 0.9%: INTRAVENOUS | Qty: 1000 | Status: AC

## 2012-05-06 NOTE — Progress Notes (Signed)
Physical Therapy Treatment Patient Details Name: Dylan Benson MRN: [id number] DOB: [birth year] Today's Date: 05/06/2012 Time: 4098-1191 PT Time Calculation (min): 27 min  PT Assessment / Plan / Recommendation Comments on Treatment Session  Pt presents to be mvoing well when OOB with RW and able to ambulate to stairs in unit gym ([facility]). Pt. also demonstrated proper bed mobility technique with only VC for maintaining back precautions. Pt. reporting less pain with ambulation than what he experiences with just sitting or lying in bed.    Follow Up Recommendations  No PT follow up;Supervision/Assistance - 24 hour     Does the patient have the potential to tolerate intense rehabilitation     Barriers to Discharge        Equipment Recommendations  Rolling walker with 5" wheels;None recommended by OT    Recommendations for Other Services    Frequency Min 5X/week   Plan      Precautions / Restrictions Precautions Precautions: Back Precaution Comments: Pt. and his wife were able to to verbalize 3/3 back precautions Required Braces or Orthoses: Spinal Brace Spinal Brace: Applied in sitting position Restrictions Weight Bearing Restrictions: No   Pertinent Vitals/Pain Patient reports pain 8/10 when sitting, 6-7/10 with ambulation; Pt. Reports using PCA pump.    Mobility  Bed Mobility Bed Mobility: Sit to Sidelying Right;Left Sidelying to Sit Right Sidelying to Sit: 4: Min guard;HOB flat Sit to Sidelying Right: 4: Min guard;HOB flat Details for Bed Mobility Assistance: Pt. wished to demonstrated how he was getting in/OOB   because he is experiencing soreness. Min guard for safety and VC for proper hand placement and for maintaining back precautions.  Transfers Transfers: Sit to Stand;Stand to Sit Sit to Stand: 4: Min assist;4: Min guard;With upper extremity assist;With armrests;From bed;From chair/3-in-1 Stand to Sit: 4: Min guard;With upper extremity assist;With armrests;To bed;To  chair/3-in-1 Details for Transfer Assistance: Pt. min (A) initially standing stating that standing was the hardest part. Min guard for sit>stands once he was "loosened up" Pt. given VC for proper hand placement on RW  Ambulation/Gait Ambulation/Gait Assistance: 4: Min guard Ambulation Distance (Feet): 220 Feet Assistive device: Rolling walker Ambulation/Gait Assistance Details: Pt slow and guarded at first, but with encoragement was able to increase his speed and walk with less antalgic/guarded pattern. Pt. given VC for proper hand placement on RW and for maintaining back precautions with directional changes. Gait Pattern: Step-through pattern;Decreased stride length;Antalgic Gait velocity: slow Stairs: Yes Stairs Assistance: 4: Min guard Stairs Assistance Details (indicate cue type and reason): Pt. able to navigate the stairs with min guard for safety and balance with step to pattern going forwards. Pt. and his wife reports that he will have (A) on the stairs at home and they felt comfortable with doing them at home upon d/c. Stair Management Technique: Two rails;Step to pattern;Forwards Number of Stairs: 3  Wheelchair Mobility Wheelchair Mobility: No      PT Goals Acute Rehab PT Goals PT Goal Formulation: With patient Time For Goal Achievement: 05/12/12 Potential to Achieve Goals: Good Pt will Roll Supine to Right Side: with modified independence PT Goal: Rolling Supine to Right Side - Progress: Progressing toward goal Pt will go Supine/Side to Sit: with modified independence;with HOB 0 degrees PT Goal: Supine/Side to Sit - Progress: Progressing toward goal Pt will go Sit to Stand: with modified independence;with upper extremity assist PT Goal: Sit to Stand - Progress: Progressing toward goal Pt will Ambulate: >150 feet;with modified independence;with rolling walker PT Goal: Ambulate -  Progress: Progressing toward goal Pt will Go Up / Down Stairs: 3-5 stairs;with supervision;with  rail(s) PT Goal: Up/Down Stairs - Progress: Progressing toward goal  Visit Information  Last PT Received On: 05/06/12 Assistance Needed: +1    Subjective Data  Subjective: "I am sore, but I want to walk" Patient Stated Goal: Home   Cognition  Overall Cognitive Status: Appears within functional limits for tasks assessed/performed Arousal/Alertness: Awake/alert Orientation Level: Appears intact for tasks assessed Behavior During Session: [facility] for tasks performed    Balance  Balance Balance Assessed: No  End of Session PT - End of Session Equipment Utilized During Treatment: Gait belt;Back brace Activity Tolerance: Patient tolerated treatment well Patient left: in chair;with call bell/phone within reach;with family/visitor present Nurse Communication: Mobility status    Tranesha Lessner, SPTA 05/06/2012, 10:10 AM

## 2012-05-06 NOTE — Progress Notes (Signed)
Patient ID: Dylan Benson, male   DOB: 1971-04-05, 41 y.o.   MRN: [id number] Still incisional pain, no weakness. Wound dry

## 2012-05-07 LAB — GLUCOSE, CAPILLARY
Glucose-Capillary: 131 mg/dL — ABNORMAL HIGH (ref 70–99)
Glucose-Capillary: 109 mg/dL — ABNORMAL HIGH (ref 70–99)

## 2012-05-07 NOTE — Discharge Summary (Signed)
Physician Discharge Summary  Patient ID: Dylan Benson MRN: [id number] DOB/AGE: 1970-09-13 41 y.o.  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 rate 16, height 6' (1.829 m), weight 95.4 kg (210 lb 5.1 oz), SpO2 94.00%. Ambulating. Wound dry  Disposition: home on roxicodone and diazepam     Medication List     As of 05/07/2012 10:27 AM    ASK your doctor about these medications         aspirin 325 MG buffered tablet   Take 325 mg by mouth daily.      gabapentin 300 MG capsule   Commonly known as: NEURONTIN   Take 300 mg by mouth 3 (three) times daily.      meloxicam 7.5 MG tablet   Commonly known as: MOBIC   Take 7.5 mg by mouth 2 (two) times daily.      metFORMIN 500 MG 24 hr tablet   Commonly known as: GLUCOPHAGE-XR   Take 500 mg by mouth 4 (four) times daily.      oxyCODONE 15 MG immediate release tablet   Commonly known as: ROXICODONE   Take 15 mg by mouth every 4 (four) hours as needed. For pain      ranitidine 150 MG tablet   Commonly known as: ZANTAC   Take 150 mg by mouth 2 (two) times daily.         Signed: Karn Cassis 05/07/2012, 10:27 AM

## 2012-05-07 NOTE — Care Management Note (Signed)
    Page 1 of 1   05/07/2012     1:38:37 PM   CARE MANAGEMENT NOTE 05/07/2012  Patient:  Dylan Benson, Dylan Benson   Account Number:  [id number]  Date Initiated:  05/05/2012  Documentation initiated by:  Jacquelynn Cree  Subjective/Objective Assessment:   Admitted postop L3-4 laminectomy     Action/Plan:   PT/OT evals- no f/u needed, needs rolling walker and 3N1   Anticipated DC Date:  05/08/2012   Anticipated DC Plan:  HOME/SELF CARE      DC Planning Services  CM consult      Choice offered to / List presented to:     DME arranged  Levan Hurst      DME agency  [facility]        Status of service:  Completed, signed off Medicare Important Message given?   (If response is "NO", the following Medicare IM given date fields will be blank) Date Medicare IM given:   Date Additional Medicare IM given:    Discharge Disposition:  HOME/SELF CARE  Per UR Regulation:  Reviewed for med. necessity/level of care/duration of stay  If discussed at Long Length of Stay Meetings, dates discussed:    Comments:  05/07/12 Per patient's RN, patient  refused the 3N1, rolling walker delivered to patient's room prior to discharge. Jacquelynn Cree RN, BSN, CCM

## 2012-05-07 NOTE — Progress Notes (Signed)
Physical Therapy Treatment Patient Details Name: Dylan Benson MRN: [id number] DOB: [birth year] Today's Date: 05/07/2012 Time: 5284-1324 PT Time Calculation (min): 23 min  PT Assessment / Plan / Recommendation Comments on Treatment Session  Pt moving well. Pt still guarded throughout although minimal deficits. Pt safe to d/c home with wife this afternoon pending doctor recommendation    Follow Up Recommendations  No PT follow up;Supervision/Assistance - 24 hour     Does the patient have the potential to tolerate intense rehabilitation     Barriers to Discharge        Equipment Recommendations  Rolling walker with 5" wheels;None recommended by OT    Recommendations for Other Services    Frequency Min 5X/week   Plan Discharge plan remains appropriate;Frequency remains appropriate    Precautions / Restrictions Precautions Precautions: Back Precaution Comments: Pt was able to verbalize 3/3 precautions. Required Braces or Orthoses: Spinal Brace Spinal Brace: Applied in sitting position Restrictions Weight Bearing Restrictions: No   Pertinent Vitals/Pain Pt with minimal pain complaints: 3/10. RN in room and given pain meds prior to session    Mobility  Bed Mobility Bed Mobility: Rolling Right;Right Sidelying to Sit;Sitting - Scoot to Edge of Bed Rolling Right: 5: Supervision Right Sidelying to Sit: 5: Supervision;HOB flat Sitting - Scoot to Edge of Bed: 6: Modified independent (Device/Increase time) Details for Bed Mobility Assistance: Pt completed bed mobility with HOB flat and no rails. Increased time to complete but cues only for sequencing Transfers Transfers: Sit to Stand;Stand to Sit Sit to Stand: 5: Supervision;With upper extremity assist;From bed Stand to Sit: 5: Supervision;With upper extremity assist;To chair/3-in-1 Details for Transfer Assistance: Cues for safe hand placement as pt wants to reach for RW upon standing. Pt able to control  descent Ambulation/Gait Ambulation/Gait Assistance: 5: Supervision Ambulation Distance (Feet): 400 Feet Assistive device: Rolling walker Ambulation/Gait Assistance Details: Cues for upright posture throughout. Gait Pattern: Step-through pattern;Decreased stride length;Antalgic Gait velocity: slow Stairs: Yes Stairs Assistance: 5: Supervision Stairs Assistance Details (indicate cue type and reason): Pt and wife completed stairs with wife assisting for stability.  Stair Management Technique: One rail Right;Step to pattern;Forwards Number of Stairs: 10  Wheelchair Mobility Wheelchair Mobility: No    Exercises     PT Diagnosis:    PT Problem List:   PT Treatment Interventions:     PT Goals Acute Rehab PT Goals PT Goal: Rolling Supine to Right Side - Progress: Progressing toward goal PT Goal: Supine/Side to Sit - Progress: Progressing toward goal PT Goal: Sit to Stand - Progress: Progressing toward goal PT Goal: Ambulate - Progress: Progressing toward goal PT Goal: Up/Down Stairs - Progress: Met  Visit Information  Last PT Received On: 05/07/12 Assistance Needed: +1    Subjective Data      Cognition  Overall Cognitive Status: Appears within functional limits for tasks assessed/performed Arousal/Alertness: Awake/alert Orientation Level: Appears intact for tasks assessed Behavior During Session: [facility] for tasks performed    Balance  Balance Balance Assessed: Yes Static Standing Balance Static Standing - Balance Support: Left upper extremity supported;During functional activity Static Standing - Level of Assistance: 6: Modified independent (Device/Increase time) Static Standing - Comment/# of Minutes: Pt standing at sink while brushing teeth, no assist needed. Cues for safe technique to avoid bending while brushing teeth  End of Session PT - End of Session Equipment Utilized During Treatment: Gait belt;Back brace Activity Tolerance: Patient tolerated treatment well Patient  left: in chair;with call bell/phone within reach;with family/visitor present Nurse Communication:  Mobility status   GP     Milana Kidney 05/07/2012, 8:37 AM

## 2012-07-19 NOTE — Progress Notes (Signed)
Shabnam Ladd, PTA [phone] 07/19/2012  

## 2012-08-10 ENCOUNTER — Other Ambulatory Visit: Payer: Self-pay | Admitting: Neurosurgery

## 2012-08-10 DIAGNOSIS — M542 Cervicalgia: Secondary | ICD-10-CM

## 2012-08-18 ENCOUNTER — Other Ambulatory Visit: Payer: BC Managed Care – PPO

## 2012-08-23 ENCOUNTER — Ambulatory Visit
Admission: RE | Admit: 2012-08-23 | Discharge: 2012-08-23 | Disposition: A | Discharge status: Home / Self Care | Payer: BC Managed Care – PPO | Source: Ambulatory Visit | Attending: Neurosurgery | Admitting: Neurosurgery

## 2012-08-23 VITALS — BP 127/74 | HR 52

## 2012-08-23 DIAGNOSIS — M542 Cervicalgia: Secondary | ICD-10-CM

## 2012-08-23 DIAGNOSIS — M549 Dorsalgia, unspecified: Secondary | ICD-10-CM

## 2012-08-23 MED ORDER — DIAZEPAM 5 MG PO TABS
10.0000 mg | ORAL_TABLET | Freq: Once | ORAL | Status: AC
  Administered 2012-08-23: 10 mg via ORAL

## 2012-08-23 MED ORDER — IOHEXOL 300 MG/ML  SOLN
10.0000 mL | Freq: Once | INTRAMUSCULAR | Status: AC | PRN
  Administered 2012-08-23: 10 mL via INTRATHECAL

## 2012-11-30 HISTORY — PX: CERVICAL FUSION: SHX112

## 2014-01-10 ENCOUNTER — Other Ambulatory Visit: Payer: Self-pay | Admitting: Anesthesiology

## 2014-01-18 ENCOUNTER — Other Ambulatory Visit: Payer: Self-pay | Admitting: Anesthesiology

## 2014-01-18 DIAGNOSIS — M5020 Other cervical disc displacement, unspecified cervical region: Secondary | ICD-10-CM

## 2014-02-09 ENCOUNTER — Ambulatory Visit
Admission: RE | Admit: 2014-02-09 | Discharge: 2014-02-09 | Disposition: A | Discharge status: Home / Self Care | Payer: BC Managed Care – PPO | Source: Ambulatory Visit | Attending: Anesthesiology | Admitting: Anesthesiology

## 2014-02-09 DIAGNOSIS — M5020 Other cervical disc displacement, unspecified cervical region: Secondary | ICD-10-CM

## 2014-02-09 MED ORDER — IOHEXOL 300 MG/ML  SOLN
100.0000 mL | Freq: Once | INTRAMUSCULAR | Status: AC | PRN
  Administered 2014-02-09: 100 mL via INTRAVENOUS

## 2014-03-02 ENCOUNTER — Telehealth: Payer: Self-pay

## 2014-03-02 NOTE — Telephone Encounter (Signed)
S/W PT'S WIFE IN REF TO NP APPT. ON 03/14/14@10 :30 REFERRING DR Maryjean Ka DX-OSTEOBLASTOMA

## 2014-03-03 ENCOUNTER — Telehealth: Payer: Self-pay | Admitting: Hematology

## 2014-03-03 NOTE — Telephone Encounter (Signed)
C/D 03/03/14 for appt. 03/14/14

## 2014-03-14 ENCOUNTER — Encounter: Payer: Self-pay | Admitting: Hematology

## 2014-03-14 ENCOUNTER — Ambulatory Visit (HOSPITAL_BASED_OUTPATIENT_CLINIC_OR_DEPARTMENT_OTHER): Payer: BC Managed Care – PPO | Admitting: Hematology

## 2014-03-14 ENCOUNTER — Ambulatory Visit (HOSPITAL_BASED_OUTPATIENT_CLINIC_OR_DEPARTMENT_OTHER): Payer: BC Managed Care – PPO

## 2014-03-14 ENCOUNTER — Other Ambulatory Visit: Payer: BC Managed Care – PPO

## 2014-03-14 ENCOUNTER — Telehealth: Payer: Self-pay | Admitting: Hematology

## 2014-03-14 VITALS — BP 146/74 | HR 80 | Temp 98.4°F | Resp 18 | Ht 72.0 in | Wt 227.0 lb

## 2014-03-14 DIAGNOSIS — M545 Low back pain: Secondary | ICD-10-CM

## 2014-03-14 DIAGNOSIS — D169 Benign neoplasm of bone and articular cartilage, unspecified: Secondary | ICD-10-CM

## 2014-03-14 DIAGNOSIS — G629 Polyneuropathy, unspecified: Secondary | ICD-10-CM

## 2014-03-14 DIAGNOSIS — R200 Anesthesia of skin: Secondary | ICD-10-CM

## 2014-03-14 DIAGNOSIS — M899 Disorder of bone, unspecified: Secondary | ICD-10-CM

## 2014-03-14 DIAGNOSIS — M79672 Pain in left foot: Secondary | ICD-10-CM

## 2014-03-14 DIAGNOSIS — C9000 Multiple myeloma not having achieved remission: Secondary | ICD-10-CM

## 2014-03-14 DIAGNOSIS — M542 Cervicalgia: Secondary | ICD-10-CM

## 2014-03-14 DIAGNOSIS — R202 Paresthesia of skin: Secondary | ICD-10-CM

## 2014-03-14 DIAGNOSIS — E119 Type 2 diabetes mellitus without complications: Secondary | ICD-10-CM

## 2014-03-14 DIAGNOSIS — M5412 Radiculopathy, cervical region: Secondary | ICD-10-CM

## 2014-03-14 LAB — CBC WITH DIFFERENTIAL/PLATELET
BASO%: 1.2 % (ref 0.0–2.0)
BASOS ABS: 0.1 10*3/uL (ref 0.0–0.1)
EOS%: 6.8 % (ref 0.0–7.0)
Eosinophils Absolute: 0.3 10*3/uL (ref 0.0–0.5)
HEMATOCRIT: 37.8 % — AB (ref 38.4–49.9)
HEMOGLOBIN: 12.5 g/dL — AB (ref 13.0–17.1)
LYMPH#: 2 10*3/uL (ref 0.9–3.3)
LYMPH%: 40.3 % (ref 14.0–49.0)
MCH: 28.2 pg (ref 27.2–33.4)
MCHC: 32.9 g/dL (ref 32.0–36.0)
MCV: 85.6 fL (ref 79.3–98.0)
MONO#: 0.5 10*3/uL (ref 0.1–0.9)
MONO%: 9.2 % (ref 0.0–14.0)
NEUT#: 2.1 10*3/uL (ref 1.5–6.5)
NEUT%: 42.5 % (ref 39.0–75.0)
Platelets: 231 10*3/uL (ref 140–400)
RBC: 4.42 10*6/uL (ref 4.20–5.82)
RDW: 15.1 % — ABNORMAL HIGH (ref 11.0–14.6)
WBC: 5 10*3/uL (ref 4.0–10.3)

## 2014-03-14 LAB — IRON AND TIBC CHCC
%SAT: 29 % (ref 20–55)
IRON: 73 ug/dL (ref 42–163)
TIBC: 251 ug/dL (ref 202–409)
UIBC: 178 ug/dL (ref 117–376)

## 2014-03-14 LAB — COMPREHENSIVE METABOLIC PANEL (CC13)
ALT: 54 U/L (ref 0–55)
AST: 44 U/L — AB (ref 5–34)
Albumin: 3.2 g/dL — ABNORMAL LOW (ref 3.5–5.0)
Alkaline Phosphatase: 86 U/L (ref 40–150)
Anion Gap: 7 mEq/L (ref 3–11)
BUN: 10.5 mg/dL (ref 7.0–26.0)
CALCIUM: 10.1 mg/dL (ref 8.4–10.4)
CHLORIDE: 103 meq/L (ref 98–109)
CO2: 25 mEq/L (ref 22–29)
CREATININE: 1 mg/dL (ref 0.7–1.3)
Glucose: 196 mg/dl — ABNORMAL HIGH (ref 70–140)
Potassium: 3.9 mEq/L (ref 3.5–5.1)
Sodium: 135 mEq/L — ABNORMAL LOW (ref 136–145)
Total Bilirubin: 0.34 mg/dL (ref 0.20–1.20)
Total Protein: 9.3 g/dL — ABNORMAL HIGH (ref 6.4–8.3)

## 2014-03-14 LAB — SEDIMENTATION RATE: Sed Rate: 44 mm/hr — ABNORMAL HIGH (ref 0–16)

## 2014-03-14 LAB — PROTIME-INR
INR: 1 — AB (ref 2.00–3.50)
Protime: 12 Seconds (ref 10.6–13.4)

## 2014-03-14 LAB — URIC ACID (CC13): URIC ACID, SERUM: 6.4 mg/dL (ref 2.6–7.4)

## 2014-03-14 LAB — MORPHOLOGY: PLT EST: ADEQUATE

## 2014-03-14 LAB — FERRITIN CHCC: Ferritin: 362 ng/ml — ABNORMAL HIGH (ref 22–316)

## 2014-03-14 LAB — LACTATE DEHYDROGENASE (CC13): LDH: 164 U/L (ref 125–245)

## 2014-03-14 NOTE — Progress Notes (Signed)
Checked in new patient with no financial issues prior to seeing the dr. He has appt card and [place] been [place] the [place]. °

## 2014-03-14 NOTE — Telephone Encounter (Signed)
, °

## 2014-03-15 ENCOUNTER — Encounter: Payer: Self-pay | Admitting: Hematology

## 2014-03-15 ENCOUNTER — Telehealth: Payer: Self-pay | Admitting: *Deleted

## 2014-03-15 DIAGNOSIS — M899 Disorder of bone, unspecified: Secondary | ICD-10-CM | POA: Insufficient documentation

## 2014-03-15 DIAGNOSIS — M5412 Radiculopathy, cervical region: Secondary | ICD-10-CM | POA: Insufficient documentation

## 2014-03-15 DIAGNOSIS — E119 Type 2 diabetes mellitus without complications: Secondary | ICD-10-CM | POA: Insufficient documentation

## 2014-03-15 NOTE — Telephone Encounter (Signed)
Called Dylan Benson to follow up on his email message. Notified him that MD will get back with him regarding his lab results.  Reports his pain is not controlled with current medication regimen: Mobic bid, Neurontin tid and Dilaudid tid, which have been ordered by Dr. Clydell Hakim (neuro). He denies that he has a "pain contract" with neurologist and that he has plenty of the Dilaudid on hand. Pain can reach 9/10 at times and is especially in his chest area. MD made aware.
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
## 2014-03-16 ENCOUNTER — Ambulatory Visit (HOSPITAL_COMMUNITY)
Admission: RE | Admit: 2014-03-16 | Discharge: 2014-03-16 | Disposition: A | Discharge status: Home / Self Care | Payer: BC Managed Care – PPO | Source: Ambulatory Visit | Attending: Hematology | Admitting: Hematology

## 2014-03-16 ENCOUNTER — Encounter: Payer: Self-pay | Admitting: Hematology

## 2014-03-16 DIAGNOSIS — C9000 Multiple myeloma not having achieved remission: Secondary | ICD-10-CM | POA: Insufficient documentation

## 2014-03-17 ENCOUNTER — Other Ambulatory Visit: Payer: Self-pay | Admitting: Medical Oncology

## 2014-03-17 ENCOUNTER — Encounter: Payer: Self-pay | Admitting: Medical Oncology

## 2014-03-17 ENCOUNTER — Telehealth: Payer: Self-pay | Admitting: Medical Oncology

## 2014-03-17 LAB — ERYTHROPOIETIN: Erythropoietin: 60.1 m[IU]/mL — ABNORMAL HIGH (ref 2.6–18.5)

## 2014-03-17 LAB — BETA 2 MICROGLOBULIN, SERUM: Beta-2 Microglobulin: 2.37 mg/L (ref <=2.51)

## 2014-03-17 LAB — VISCOSITY, SERUM: Viscosity, Serum: 2.1 rel to H2O — ABNORMAL HIGH (ref 1.5–1.9)

## 2014-03-17 LAB — SPEP & IFE WITH QIG
ALBUMIN ELP: 46 % — AB (ref 55.8–66.1)
ALPHA-1-GLOBULIN: 3.5 % (ref 2.9–4.9)
Alpha-2-Globulin: 8.7 % (ref 7.1–11.8)
BETA GLOBULIN: 4.9 % (ref 4.7–7.2)
Beta 2: 4.1 % (ref 3.2–6.5)
Gamma Globulin: 32.8 % — ABNORMAL HIGH (ref 11.1–18.8)
IgA: 135 mg/dL (ref 68–379)
IgG (Immunoglobin G), Serum: 3050 mg/dL — ABNORMAL HIGH (ref 650–1600)
IgM, Serum: 30 mg/dL — ABNORMAL LOW (ref 41–251)
M-Spike, %: 2.6 g/dL
Total Protein, Serum Electrophoresis: 8.8 g/dL — ABNORMAL HIGH (ref 6.0–8.3)

## 2014-03-17 LAB — KAPPA/LAMBDA LIGHT CHAINS
Kappa free light chain: 104 mg/dL — ABNORMAL HIGH (ref 0.33–1.94)
Kappa:Lambda Ratio: 113.04 — ABNORMAL HIGH (ref 0.26–1.65)
Lambda Free Lght Chn: 0.92 mg/dL (ref 0.57–2.63)

## 2014-03-17 LAB — PHOSPHORUS: Phosphorus: 3.7 mg/dL (ref 2.3–4.6)

## 2014-03-17 MED ORDER — ONDANSETRON HCL 8 MG PO TABS: 8.0000 mg | ORAL_TABLET | Freq: Three times a day (TID) | ORAL | Status: DC | PRN

## 2014-03-17 NOTE — Telephone Encounter (Signed)
Pt's wife called and left a message requesting bone survey results. She also stated pt is having more pain at night and wanted to know what we can do.

## 2014-03-17 NOTE — Telephone Encounter (Signed)
I called wife to let her know, I had spoken with the pt about the same concerns she had called me about. She states that pt now feels quizzy and he does not have anything for nausea. I told her we will call him in a prescription. I informed her  when Dr. Caryn Bee returns on Monday they can get results from the survey and he can make adjustments in his pain medication if needed. I instructed her to call if they have any problems over the week-end. She voiced understanding.

## 2014-03-17 NOTE — Telephone Encounter (Signed)
I called pt to let him know that Dr. Caryn Bee is out of the office today. He will need to review the results of his bone survey before we can give results. I also explained that the IR physician would like to get a CT before he does the biopsy so he will not get the appointment until Dr. Lona Kettle orders this. I asked him about his pain. He states he is doing ok but has more pain at night. I will discuss with Adrena and call him back. He voiced understanding.

## 2014-03-21 ENCOUNTER — Telehealth: Payer: Self-pay | Admitting: *Deleted

## 2014-03-21 ENCOUNTER — Other Ambulatory Visit: Payer: Self-pay | Admitting: Radiology

## 2014-03-21 ENCOUNTER — Encounter: Payer: Self-pay | Admitting: *Deleted

## 2014-03-21 NOTE — Telephone Encounter (Signed)
WOULD LIKE THE RESULTS OF THE BONE SURVEY. ALSO HAVE THE CT SCANS OF THE CHEST, ABDOMEN, AND PELVIS BEEN SCHEDULED? NOTIFIED DR.SEHBAI'S NURSE, STACEY CAMP,RN.

## 2014-03-21 NOTE — Telephone Encounter (Signed)
Spoke with Tiffany in IR regarding BM biopsy for pt, per Tiffany, Dr. Kathlene Cote recommends pt having CT chest, abdomen and pelvis prior to BM biopsy.

## 2014-03-21 NOTE — Progress Notes (Signed)
Received call from IR that pt is scheduled for BM biopsy tomorrow at 09:00AM.  Pt called and instructed about procedure per IR.  Dr. Lona Kettle notified of scheduled BM biopsy for tomorrow.

## 2014-03-22 ENCOUNTER — Ambulatory Visit (HOSPITAL_COMMUNITY)
Admission: RE | Admit: 2014-03-22 | Discharge: 2014-03-22 | Disposition: A | Discharge status: Home / Self Care | Payer: BC Managed Care – PPO | Source: Ambulatory Visit | Attending: Physician Assistant | Admitting: Physician Assistant

## 2014-03-22 ENCOUNTER — Encounter (HOSPITAL_COMMUNITY): Payer: Self-pay

## 2014-03-22 ENCOUNTER — Other Ambulatory Visit: Payer: Self-pay | Admitting: Hematology

## 2014-03-22 ENCOUNTER — Ambulatory Visit (HOSPITAL_COMMUNITY)
Admission: RE | Admit: 2014-03-22 | Discharge: 2014-03-22 | Disposition: A | Discharge status: Home / Self Care | Payer: BC Managed Care – PPO | Source: Ambulatory Visit | Attending: Hematology | Admitting: Hematology

## 2014-03-22 DIAGNOSIS — C9000 Multiple myeloma not having achieved remission: Secondary | ICD-10-CM

## 2014-03-22 DIAGNOSIS — M899 Disorder of bone, unspecified: Secondary | ICD-10-CM | POA: Diagnosis not present

## 2014-03-22 DIAGNOSIS — E119 Type 2 diabetes mellitus without complications: Secondary | ICD-10-CM | POA: Diagnosis not present

## 2014-03-22 HISTORY — PX: BONE MARROW BIOPSY: SHX199

## 2014-03-22 LAB — CBC WITH DIFFERENTIAL/PLATELET
BASOS ABS: 0 10*3/uL (ref 0.0–0.1)
BASOS PCT: 1 % (ref 0–1)
EOS PCT: 8 % — AB (ref 0–5)
Eosinophils Absolute: 0.5 10*3/uL (ref 0.0–0.7)
HCT: 37.1 % — ABNORMAL LOW (ref 39.0–52.0)
Hemoglobin: 12.9 g/dL — ABNORMAL LOW (ref 13.0–17.0)
LYMPHS PCT: 36 % (ref 12–46)
Lymphs Abs: 2.1 10*3/uL (ref 0.7–4.0)
MCH: 29.3 pg (ref 26.0–34.0)
MCHC: 34.8 g/dL (ref 30.0–36.0)
MCV: 84.1 fL (ref 78.0–100.0)
Monocytes Absolute: 0.4 10*3/uL (ref 0.1–1.0)
Monocytes Relative: 7 % (ref 3–12)
Neutro Abs: 2.9 10*3/uL (ref 1.7–7.7)
Neutrophils Relative %: 48 % (ref 43–77)
Platelets: 224 10*3/uL (ref 150–400)
RBC: 4.41 MIL/uL (ref 4.22–5.81)
RDW: 13.6 % (ref 11.5–15.5)
WBC: 5.9 10*3/uL (ref 4.0–10.5)

## 2014-03-22 LAB — BONE MARROW EXAM

## 2014-03-22 LAB — APTT: APTT: 26 s (ref 24–37)

## 2014-03-22 LAB — PROTIME-INR
INR: 0.93 (ref 0.00–1.49)
PROTHROMBIN TIME: 12.6 s (ref 11.6–15.2)

## 2014-03-22 LAB — GLUCOSE, CAPILLARY: Glucose-Capillary: 200 mg/dL — ABNORMAL HIGH (ref 70–99)

## 2014-03-22 MED ORDER — HYDROCODONE-ACETAMINOPHEN 5-325 MG PO TABS
1.0000 | ORAL_TABLET | ORAL | Status: DC | PRN
  Filled 2014-03-22: qty 2

## 2014-03-22 MED ORDER — SODIUM CHLORIDE 0.9 % IV SOLN
INTRAVENOUS | Status: DC
  Administered 2014-03-22: 09:00:00 via INTRAVENOUS

## 2014-03-22 MED ORDER — FENTANYL CITRATE 0.05 MG/ML IJ SOLN
INTRAMUSCULAR | Status: AC | PRN
  Administered 2014-03-22 (×4): 50 ug via INTRAVENOUS

## 2014-03-22 MED ORDER — MIDAZOLAM HCL 2 MG/2ML IJ SOLN
INTRAMUSCULAR | Status: AC | PRN
  Administered 2014-03-22: 2 mg via INTRAVENOUS
  Administered 2014-03-22 (×2): 1 mg via INTRAVENOUS

## 2014-03-22 MED ORDER — FENTANYL CITRATE 0.05 MG/ML IJ SOLN
INTRAMUSCULAR | Status: AC
  Filled 2014-03-22: qty 4

## 2014-03-22 MED ORDER — MIDAZOLAM HCL 2 MG/2ML IJ SOLN
INTRAMUSCULAR | Status: AC
  Filled 2014-03-22: qty 4

## 2014-03-22 NOTE — H&P (Signed)
Agree with PA note. 

## 2014-03-22 NOTE — H&P (Signed)
Chief Complaint: "I'm here for a bone marrow biopsy" Referring Physician:Sehbai HPI: Dylan Benson is an 43 y.o. male with newly diagnosed multiple myeloma by bone survey and lab workup. He is referred for bone marrow biopsy today. PMHx, meds, labs reviewed  Past Medical History:  Past Medical History  Diagnosis Date  . Kidney stone   . GERD (gastroesophageal reflux disease)   . Diabetes mellitus without complication     TYPE 2  . Cervical radiculopathy   . Lytic bone lesions on xray     Past Surgical History:  Past Surgical History  Procedure Laterality Date  . Left arm pinning      fracture arm  . Pins removed      Rigth arm  . Rotator cuff repair      right  . Lumbar fusion  05/04/2012  . Back surgery    . Cervical fusion  July 2014    Family History:  Family History  Problem Relation Age of Onset  . Congestive Heart Failure Mother   . Panic disorder Mother   . Diabetes Father   . Congestive Heart Failure Father     Social History:  reports that he quit smoking about 7 years ago. His smoking use included Cigarettes. He has a 52 pack-year smoking history. He has never used smokeless tobacco. He reports that he does not drink alcohol or use illicit drugs.  Allergies:  Allergies  Allergen Reactions  . Aleve [Naproxen Sodium] Anaphylaxis    Medications:   Medication List    ASK your doctor about these medications       diazepam 5 MG tablet  Commonly known as:  VALIUM  Take 5 mg by mouth at bedtime.     gabapentin 600 MG tablet  Commonly known as:  NEURONTIN  Take 600 mg by mouth 3 (three) times daily.     HYDROmorphone 4 MG tablet  Commonly known as:  DILAUDID  Take 4 mg by mouth every 8 (eight) hours.     influenza vac split quadrivalent PF 0.5 ML injection  Commonly known as:  FLUARIX  Inject 0.5 mLs into the muscle once.     meloxicam 7.5 MG tablet  Commonly known as:  MOBIC  Take 7.5 mg by mouth 3 (three) times daily.     metFORMIN 500 MG  tablet  Commonly known as:  GLUCOPHAGE  Take 1,000 mg by mouth 2 (two) times daily with a meal.     ondansetron 8 MG tablet  Commonly known as:  ZOFRAN  Take 1 tablet (8 mg total) by mouth every 8 (eight) hours as needed for nausea or vomiting.     ranitidine 150 MG tablet  Commonly known as:  ZANTAC  Take 300 mg by mouth daily as needed for heartburn.        Please HPI for pertinent positives, otherwise complete 10 system ROS negative.  Physical Exam: BP 140/86  Pulse 81  Temp(Src) 98.1 F (36.7 C) (Oral)  Resp 20  Ht 6' (1.829 m)  Wt 227 lb (102.967 kg)  BMI 30.78 kg/m2  SpO2 98% Body mass index is 30.78 kg/(m^2).   General Appearance:  Alert, cooperative, no distress, appears stated age  Head:  Normocephalic, without obvious abnormality, atraumatic  ENT: Unremarkable  Neck: Supple, symmetrical, trachea midline  Lungs:   Clear to auscultation bilaterally, no w/r/r, respirations unlabored without use of accessory muscles.  Chest Wall:  No tenderness or deformity  Heart:  Regular rate and rhythm,  S1, S2 normal, no murmur, rub or gallop.  Neurologic: Normal affect, no gross deficits.  Labs: Results for orders placed during the hospital encounter of 03/22/14 (from the past 48 hour(s))  APTT     Status: None   Collection Time    03/22/14  9:20 AM      Result Value Ref Range   aPTT 26  24 - 37 seconds  CBC WITH DIFFERENTIAL     Status: Abnormal   Collection Time    03/22/14  9:20 AM      Result Value Ref Range   WBC 5.9  4.0 - 10.5 K/uL   RBC 4.41  4.22 - 5.81 MIL/uL   Hemoglobin 12.9 (*) 13.0 - 17.0 g/dL   HCT 37.1 (*) 39.0 - 52.0 %   MCV 84.1  78.0 - 100.0 fL   MCH 29.3  26.0 - 34.0 pg   MCHC 34.8  30.0 - 36.0 g/dL   RDW 13.6  11.5 - 15.5 %   Platelets 224  150 - 400 K/uL   Neutrophils Relative % 48  43 - 77 %   Neutro Abs 2.9  1.7 - 7.7 K/uL   Lymphocytes Relative 36  12 - 46 %   Lymphs Abs 2.1  0.7 - 4.0 K/uL   Monocytes Relative 7  3 - 12 %   Monocytes  Absolute 0.4  0.1 - 1.0 K/uL   Eosinophils Relative 8 (*) 0 - 5 %   Eosinophils Absolute 0.5  0.0 - 0.7 K/uL   Basophils Relative 1  0 - 1 %   Basophils Absolute 0.0  0.0 - 0.1 K/uL  PROTIME-INR     Status: None   Collection Time    03/22/14  9:20 AM      Result Value Ref Range   Prothrombin Time 12.6  11.6 - 15.2 seconds   INR 0.93  0.00 - 1.49    Imaging: No results found.  Assessment/Plan Multiple myeloma For CT guided bone marrow biopsy Explained procedure, risks, complications, use of sedation Labs reviewed, ok Consent signed in chart  Ascencion Dike PA-C 03/22/2014, 10:07 AM

## 2014-03-22 NOTE — Discharge Instructions (Signed)
Conscious Sedation, Adult, Care After Refer to this sheet in the next few weeks. These instructions provide you with information on caring for yourself after your procedure. Your health care provider may also give you more specific instructions. Your treatment has been planned according to current medical practices, but problems sometimes occur. Call your health care provider if you have any problems or questions after your procedure. WHAT TO EXPECT AFTER THE PROCEDURE  After your procedure:  You may feel sleepy, clumsy, and have poor balance for several hours.  Vomiting may occur if you eat too soon after the procedure. HOME CARE INSTRUCTIONS  Do not participate in any activities where you could become injured for at least 24 hours. Do not:  Drive.  Swim.  Ride a bicycle.  Operate heavy machinery.  Cook.  Use power tools.  Climb ladders.  Work from a high place.  Do not make important decisions or sign legal documents until you are improved.  If you vomit, drink water, juice, or soup when you can drink without vomiting. Make sure you have little or no nausea before eating solid foods.  Only take over-the-counter or prescription medicines for pain, discomfort, or fever as directed by your health care provider.  Make sure you and your family fully understand everything about the medicines given to you, including what side effects may occur.  You should not drink alcohol, take sleeping pills, or take medicines that cause drowsiness for at least 24 hours.  If you smoke, do not smoke without supervision.  If you are feeling better, you may resume normal activities 24 hours after you were sedated.  Keep all appointments with your health care provider. SEEK MEDICAL CARE IF:  Your skin is pale or bluish in color.  You continue to feel nauseous or vomit.  Your pain is getting worse and is not helped by medicine.  You have bleeding or swelling.  You are still sleepy or  feeling clumsy after 24 hours. SEEK IMMEDIATE MEDICAL CARE IF:  You develop a rash.  You have difficulty breathing.  You develop any type of allergic problem.  You have a fever. MAKE SURE YOU:  Understand these instructions.  Will watch your condition.  Will get help right away if you are not doing well or get worse. Document Released: 03/09/2013 Document Reviewed: 03/09/2013 [facility] Patient Information 2015 [place], [place]. This information is not intended to replace advice given to you by your health care provider. Make sure you discuss any questions you have with your health care provider.   Bone Marrow Biopsy, Needle, Care After Read the instructions outlined below and refer to this sheet in the next few weeks. These discharge instructions provide you with general information on caring for yourself after you leave the hospital. Your caregiver may also give you specific instructions. While your treatment has been planned according to the most current medical practices available, unavoidable complications sometimes occur. If you have any problems or questions after discharge, call your caregiver. Finding out the results of your test Not all test results are available during your visit. If your test results are not back during the visit, make an appointment with your caregiver to find out the results. Do not assume everything is normal if you have not heard from your caregiver or the medical facility. It is important for you to follow up on all of your test results.  SEEK MEDICAL CARE IF:   You have redness, swelling, or increasing pain at the site of  the biopsy.  You have pus coming from the biopsy site.  You have drainage from the biopsy site lasting longer than 1 day.  You notice a bad smell coming from the biopsy site or dressing.  You develop persistent nausea or vomiting. SEEK IMMEDIATE MEDICAL CARE IF:  You have a fever.  You develop a rash.  You have difficulty  breathing.  You develop any reaction or side effects to medicines given. Document Released: 12/06/2004 Document Revised: 03/09/2013 Document Reviewed: 10/24/2008 [facility] Patient Information 2015 [place], [place]. This information is not intended to replace advice given to you by your health care provider. Make sure you discuss any questions you have with your health care provider.

## 2014-03-22 NOTE — Procedures (Signed)
CT guided bone marrow biopsy from right iliac bone.  CT guided biopsy of lucent lesion in left iliac bone.  No immediate complication.

## 2014-03-23 ENCOUNTER — Other Ambulatory Visit: Payer: Self-pay | Admitting: *Deleted

## 2014-03-23 ENCOUNTER — Encounter: Payer: Self-pay | Admitting: Hematology

## 2014-03-29 ENCOUNTER — Telehealth: Payer: Self-pay | Admitting: Hematology

## 2014-03-29 ENCOUNTER — Ambulatory Visit (HOSPITAL_BASED_OUTPATIENT_CLINIC_OR_DEPARTMENT_OTHER): Payer: BC Managed Care – PPO | Admitting: Hematology

## 2014-03-29 VITALS — BP 152/78 | HR 84 | Temp 97.7°F | Resp 18 | Ht 72.0 in | Wt 223.9 lb

## 2014-03-29 DIAGNOSIS — Z23 Encounter for immunization: Secondary | ICD-10-CM

## 2014-03-29 DIAGNOSIS — M899 Disorder of bone, unspecified: Secondary | ICD-10-CM

## 2014-03-29 DIAGNOSIS — C9000 Multiple myeloma not having achieved remission: Secondary | ICD-10-CM

## 2014-03-29 LAB — CHROMOSOME ANALYSIS, BONE MARROW

## 2014-03-29 LAB — TISSUE HYBRIDIZATION (BONE MARROW)-NCBH

## 2014-03-29 MED ORDER — PNEUMOCOCCAL VAC POLYVALENT 25 MCG/0.5ML IJ INJ
0.5000 mL | INJECTION | Freq: Once | INTRAMUSCULAR | Status: AC
  Administered 2014-03-29: 0.5 mL via INTRAMUSCULAR
  Filled 2014-03-29: qty 0.5

## 2014-03-29 NOTE — Telephone Encounter (Signed)
Pt confirmed labs/ov per 10/28 POF, sent msg to add chemo, gave pt AVS.... KJ °

## 2014-03-30 ENCOUNTER — Telehealth: Payer: Self-pay

## 2014-03-30 ENCOUNTER — Encounter: Payer: Self-pay | Admitting: Hematology

## 2014-03-30 ENCOUNTER — Telehealth: Payer: Self-pay | Admitting: *Deleted

## 2014-03-30 DIAGNOSIS — C9000 Multiple myeloma not having achieved remission: Secondary | ICD-10-CM | POA: Insufficient documentation

## 2014-03-30 MED ORDER — PROCHLORPERAZINE MALEATE 10 MG PO TABS: 10.0000 mg | ORAL_TABLET | Freq: Four times a day (QID) | ORAL | Status: DC | PRN

## 2014-03-30 MED ORDER — DEXAMETHASONE 4 MG PO TABS: ORAL_TABLET | ORAL | Status: DC

## 2014-03-30 MED ORDER — LENALIDOMIDE 25 MG PO CAPS: 25.0000 mg | ORAL_CAPSULE | Freq: Every day | ORAL | Status: DC

## 2014-03-30 MED ORDER — ONDANSETRON HCL 8 MG PO TABS: 8.0000 mg | ORAL_TABLET | Freq: Two times a day (BID) | ORAL | Status: DC

## 2014-03-30 MED ORDER — ACYCLOVIR 400 MG PO TABS: 400.0000 mg | ORAL_TABLET | Freq: Two times a day (BID) | ORAL | Status: DC

## 2014-03-30 MED ORDER — LORAZEPAM 0.5 MG PO TABS: 0.5000 mg | ORAL_TABLET | Freq: Four times a day (QID) | ORAL | Status: DC | PRN

## 2014-03-30 NOTE — Patient Instructions (Signed)
RVD regimen drug information given Zometa drug information given

## 2014-03-30 NOTE — Telephone Encounter (Signed)
Per staff message and POF I have scheduled appts. Advised scheduler of appts, and to move labs. JMW  

## 2014-03-30 NOTE — Telephone Encounter (Signed)
S/w pt that he needs to sign revlimid paperwork. He will come in about 430 pm before his chemo class on 11/3 to sign it. He lives about 1 hour away from [facility].
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
## 2014-03-31 ENCOUNTER — Telehealth: Payer: Self-pay | Admitting: *Deleted

## 2014-03-31 ENCOUNTER — Telehealth: Payer: Self-pay | Admitting: Hematology

## 2014-03-31 ENCOUNTER — Encounter: Payer: Self-pay | Admitting: Hematology

## 2014-03-31 ENCOUNTER — Other Ambulatory Visit: Payer: Self-pay | Admitting: Hematology

## 2014-03-31 DIAGNOSIS — C9000 Multiple myeloma not having achieved remission: Secondary | ICD-10-CM

## 2014-03-31 MED ORDER — FENTANYL 25 MCG/HR TD PT72: 25.0000 ug | MEDICATED_PATCH | TRANSDERMAL | Status: DC

## 2014-03-31 NOTE — Progress Notes (Signed)
Faxed revlimid prescription to [facility] °

## 2014-03-31 NOTE — Telephone Encounter (Signed)
Pt came to pick up rx for pain med. Pt requested to sign Revlimid paperwork. Reviewed pt education packet with patient, verbalized understanding of packet and pt signed form. Will be coming in for chemo class on Tuesday.

## 2014-03-31 NOTE — Telephone Encounter (Signed)
s.w. pt and advised on appts...Marland Kitchenok and aware..he will get sched at visit

## 2014-03-31 NOTE — Telephone Encounter (Signed)
Patient reports his pain is constant "7-10" now despite Hydromorphone every 8 hours and his gabapentin tid. Pain is in his back,chest and ribs. Per Dr. Lona Kettle, will add Fentanyl 25 mcg patch for pain. Made patient aware of how the patch works and how to apply.

## 2014-04-03 ENCOUNTER — Other Ambulatory Visit: Payer: Self-pay | Admitting: *Deleted

## 2014-04-03 DIAGNOSIS — C9000 Multiple myeloma not having achieved remission: Secondary | ICD-10-CM

## 2014-04-03 MED ORDER — LENALIDOMIDE 25 MG PO CAPS: ORAL_CAPSULE | ORAL | Status: DC

## 2014-04-03 NOTE — Telephone Encounter (Signed)
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

## 2014-04-04 ENCOUNTER — Encounter: Payer: Self-pay | Admitting: Hematology

## 2014-04-04 ENCOUNTER — Encounter: Payer: Self-pay | Admitting: *Deleted

## 2014-04-04 ENCOUNTER — Other Ambulatory Visit: Payer: BC Managed Care – PPO

## 2014-04-05 ENCOUNTER — Encounter: Payer: Self-pay | Admitting: Hematology

## 2014-04-06 ENCOUNTER — Encounter (HOSPITAL_COMMUNITY): Payer: Self-pay

## 2014-04-10 ENCOUNTER — Other Ambulatory Visit (HOSPITAL_BASED_OUTPATIENT_CLINIC_OR_DEPARTMENT_OTHER): Payer: BC Managed Care – PPO

## 2014-04-10 ENCOUNTER — Other Ambulatory Visit: Payer: Self-pay | Admitting: Hematology

## 2014-04-10 ENCOUNTER — Ambulatory Visit (HOSPITAL_BASED_OUTPATIENT_CLINIC_OR_DEPARTMENT_OTHER): Payer: BC Managed Care – PPO

## 2014-04-10 DIAGNOSIS — M899 Disorder of bone, unspecified: Secondary | ICD-10-CM

## 2014-04-10 DIAGNOSIS — C9000 Multiple myeloma not having achieved remission: Secondary | ICD-10-CM

## 2014-04-10 DIAGNOSIS — Z5112 Encounter for antineoplastic immunotherapy: Secondary | ICD-10-CM

## 2014-04-10 LAB — CBC WITH DIFFERENTIAL/PLATELET
BASO%: 0.9 % (ref 0.0–2.0)
Basophils Absolute: 0 10*3/uL (ref 0.0–0.1)
EOS%: 6.5 % (ref 0.0–7.0)
Eosinophils Absolute: 0.3 10*3/uL (ref 0.0–0.5)
HCT: 36.9 % — ABNORMAL LOW (ref 38.4–49.9)
HGB: 12.6 g/dL — ABNORMAL LOW (ref 13.0–17.1)
LYMPH%: 32.2 % (ref 14.0–49.0)
MCH: 28.5 pg (ref 27.2–33.4)
MCHC: 34.1 g/dL (ref 32.0–36.0)
MCV: 83.5 fL (ref 79.3–98.0)
MONO#: 0.3 10*3/uL (ref 0.1–0.9)
MONO%: 5.6 % (ref 0.0–14.0)
NEUT#: 2.4 10*3/uL (ref 1.5–6.5)
NEUT%: 54.8 % (ref 39.0–75.0)
NRBC: 0 % (ref 0–0)
Platelets: 227 10*3/uL (ref 140–400)
RBC: 4.42 10*6/uL (ref 4.20–5.82)
RDW: 14 % (ref 11.0–14.6)
WBC: 4.4 10*3/uL (ref 4.0–10.3)
lymph#: 1.4 10*3/uL (ref 0.9–3.3)

## 2014-04-10 MED ORDER — BORTEZOMIB CHEMO SQ INJECTION 3.5 MG (2.5MG/ML)
1.3000 mg/m2 | Freq: Once | INTRAMUSCULAR | Status: AC
  Administered 2014-04-10: 3 mg via SUBCUTANEOUS
  Filled 2014-04-10: qty 3

## 2014-04-10 MED ORDER — ONDANSETRON 8 MG/50ML IVPB (CHCC): 8.0000 mg | Freq: Once | INTRAVENOUS | Status: DC

## 2014-04-10 MED ORDER — DEXAMETHASONE 4 MG PO TABS: ORAL_TABLET | ORAL | Status: DC

## 2014-04-10 MED ORDER — ACYCLOVIR 400 MG PO TABS: 400.0000 mg | ORAL_TABLET | Freq: Two times a day (BID) | ORAL | Status: DC

## 2014-04-10 MED ORDER — ONDANSETRON HCL 8 MG PO TABS
ORAL_TABLET | ORAL | Status: AC
  Filled 2014-04-10: qty 1

## 2014-04-10 MED ORDER — ZOLEDRONIC ACID 4 MG/100ML IV SOLN
4.0000 mg | Freq: Once | INTRAVENOUS | Status: AC
  Administered 2014-04-10: 4 mg via INTRAVENOUS
  Filled 2014-04-10: qty 100

## 2014-04-10 MED ORDER — SODIUM CHLORIDE 0.9 % IV SOLN
Freq: Once | INTRAVENOUS | Status: AC
  Administered 2014-04-10: 11:00:00 via INTRAVENOUS

## 2014-04-10 MED ORDER — BORTEZOMIB CHEMO IV INJECTION 3.5 MG: 1.3000 mg/m2 | Freq: Once | INTRAMUSCULAR | Status: DC

## 2014-04-10 MED ORDER — PROCHLORPERAZINE MALEATE 10 MG PO TABS: 10.0000 mg | ORAL_TABLET | Freq: Four times a day (QID) | ORAL | Status: DC | PRN

## 2014-04-10 MED ORDER — ONDANSETRON HCL 8 MG PO TABS
8.0000 mg | ORAL_TABLET | Freq: Once | ORAL | Status: AC
  Administered 2014-04-10: 8 mg via ORAL

## 2014-04-10 NOTE — Patient Instructions (Addendum)
[facility] Discharge Instructions for Patients Receiving Chemotherapy  Today you received the following chemotherapy agents :  Velcade,  Zometa.  To help prevent nausea and vomiting after your treatment, we encourage you to take your nausea medication as prescribed by your physician.  Take Zofran 70m by mouth every 8 hours as needed for nausea.  Can also take Compazine 114mby mouth every 6 hours as needed for nausea.  DO  NOT  Drive after taking Compazine as it can cause drowsiness. DO  Drink  Lots  Of  Fluids  As  Tolerated.   DO  NOT  Eat  Greasy  Nor  Spicy  Foods.    If you develop nausea and vomiting that is not controlled by your nausea medication, call the clinic.   BELOW ARE SYMPTOMS THAT SHOULD BE REPORTED IMMEDIATELY:  *FEVER GREATER THAN 100.5 F  *CHILLS WITH OR WITHOUT FEVER  NAUSEA AND VOMITING THAT IS NOT CONTROLLED WITH YOUR NAUSEA MEDICATION  *UNUSUAL SHORTNESS OF BREATH  *UNUSUAL BRUISING OR BLEEDING  TENDERNESS IN MOUTH AND THROAT WITH OR WITHOUT PRESENCE OF ULCERS  *URINARY PROBLEMS  *BOWEL PROBLEMS  UNUSUAL RASH Items with * indicate a potential emergency and should be followed up as soon as possible.  Feel free to call the clinic you have any questions or concerns. The clinic phone number is [phone].     Bortezomib injection What is this medicine? BORTEZOMIB (bor TEZ oh mib) is a chemotherapy drug. It slows the growth of cancer cells. This medicine is used to treat multiple myeloma, and certain lymphomas, such as mantle-cell lymphoma. This medicine may be used for other purposes; ask your health care provider or pharmacist if you have questions. COMMON BRAND NAME(S): Velcade What should I tell my health care provider before I take this medicine? They need to know if you have any of these conditions: -diabetes -heart disease -irregular heartbeat -liver disease -on hemodialysis -low blood counts, like low white blood cells,  platelets, or hemoglobin -peripheral neuropathy -taking medicine for blood pressure -an unusual or allergic reaction to bortezomib, mannitol, boron, other medicines, foods, dyes, or preservatives -pregnant or trying to get pregnant -breast-feeding How should I use this medicine? This medicine is for injection into a vein or for injection under the skin. It is given by a health care professional in a hospital or clinic setting. Talk to your pediatrician regarding the use of this medicine in children. Special care may be needed. Overdosage: If you think you have taken too much of this medicine contact a poison control center or emergency room at once. NOTE: This medicine is only for you. Do not share this medicine with others. What if I miss a dose? It is important not to miss your dose. Call your doctor or health care professional if you are unable to keep an appointment. What may interact with this medicine? This medicine may interact with the following medications: -ketoconazole -rifampin -ritonavir -St. John's Wort This list may not describe all possible interactions. Give your health care provider a list of all the medicines, herbs, non-prescription drugs, or dietary supplements you use. Also tell them if you smoke, drink alcohol, or use illegal drugs. Some items may interact with your medicine. What should I watch for while using this medicine? Visit your doctor for checks on your progress. This drug may make you feel generally unwell. This is not uncommon, as chemotherapy can affect healthy cells as well as cancer cells. Report any side  effects. Continue your course of treatment even though you feel ill unless your doctor tells you to stop. You may get drowsy or dizzy. Do not drive, use machinery, or do anything that needs mental alertness until you know how this medicine affects you. Do not stand or sit up quickly, especially if you are an older patient. This reduces the risk of dizzy or  fainting spells. In some cases, you may be given additional medicines to help with side effects. Follow all directions for their use. Call your doctor or health care professional for advice if you get a fever, chills or sore throat, or other symptoms of a cold or flu. Do not treat yourself. This drug decreases your body's ability to fight infections. Try to avoid being around people who are sick. This medicine may increase your risk to bruise or bleed. Call your doctor or health care professional if you notice any unusual bleeding. You may need blood work done while you are taking this medicine. In some patients, this medicine may cause a serious brain infection that may cause death. If you have any problems seeing, thinking, speaking, walking, or standing, tell your doctor right away. If you cannot reach your doctor, urgently seek other source of medical care. Do not become pregnant while taking this medicine. Women should inform their doctor if they wish to become pregnant or think they might be pregnant. There is a potential for serious side effects to an unborn child. Talk to your health care professional or pharmacist for more information. Do not breast-feed an infant while taking this medicine. Check with your doctor or health care professional if you get an attack of severe diarrhea, nausea and vomiting, or if you sweat a lot. The loss of too much body fluid can make it dangerous for you to take this medicine. What side effects may I notice from receiving this medicine? Side effects that you should report to your doctor or health care professional as soon as possible: -allergic reactions like skin rash, itching or hives, swelling of the face, lips, or tongue -breathing problems -changes in hearing -changes in vision -fast, irregular heartbeat -feeling faint or lightheaded, falls -pain, tingling, numbness in the hands or feet -right upper belly pain -seizures -swelling of the ankles, feet,  hands -unusual bleeding or bruising -unusually weak or tired -vomiting -yellowing of the eyes or skin Side effects that usually do not require medical attention (report to your doctor or health care professional if they continue or are bothersome): -changes in emotions or moods -constipation -diarrhea -loss of appetite -headache -irritation at site where injected -nausea This list may not describe all possible side effects. Call your doctor for medical advice about side effects. You may report side effects to FDA at 1-800-FDA-1088. Where should I keep my medicine? This drug is given in a hospital or clinic and will not be stored at home. NOTE: This sheet is a summary. It may not cover all possible information. If you have questions about this medicine, talk to your doctor, pharmacist, or health care provider.  2015, Elsevier/Gold Standard. (2013-03-14 12:46:32)   Zoledronic Acid injection (Hypercalcemia, Oncology) What is this medicine? ZOLEDRONIC ACID (ZOE le dron ik AS id) lowers the amount of calcium loss from bone. It is used to treat too much calcium in your blood from cancer. It is also used to prevent complications of cancer that has spread to the bone. This medicine may be used for other purposes; ask your health care provider or  pharmacist if you have questions. COMMON BRAND NAME(S): Zometa What should I tell my health care provider before I take this medicine? They need to know if you have any of these conditions: -aspirin-sensitive asthma -cancer, especially if you are receiving medicines used to treat cancer -dental disease or wear dentures -infection -kidney disease -receiving corticosteroids like dexamethasone or prednisone -an unusual or allergic reaction to zoledronic acid, other medicines, foods, dyes, or preservatives -pregnant or trying to get pregnant -breast-feeding How should I use this medicine? This medicine is for infusion into a vein. It is given by a  health care professional in a hospital or clinic setting. Talk to your pediatrician regarding the use of this medicine in children. Special care may be needed. Overdosage: If you think you have taken too much of this medicine contact a poison control center or emergency room at once. NOTE: This medicine is only for you. Do not share this medicine with others. What if I miss a dose? It is important not to miss your dose. Call your doctor or health care professional if you are unable to keep an appointment. What may interact with this medicine? -certain antibiotics given by injection -NSAIDs, medicines for pain and inflammation, like ibuprofen or naproxen -some diuretics like bumetanide, furosemide -teriparatide -thalidomide This list may not describe all possible interactions. Give your health care provider a list of all the medicines, herbs, non-prescription drugs, or dietary supplements you use. Also tell them if you smoke, drink alcohol, or use illegal drugs. Some items may interact with your medicine. What should I watch for while using this medicine? Visit your doctor or health care professional for regular checkups. It may be some time before you see the benefit from this medicine. Do not stop taking your medicine unless your doctor tells you to. Your doctor may order blood tests or other tests to see how you are doing. Women should inform their doctor if they wish to become pregnant or think they might be pregnant. There is a potential for serious side effects to an unborn child. Talk to your health care professional or pharmacist for more information. You should make sure that you get enough calcium and vitamin D while you are taking this medicine. Discuss the foods you eat and the vitamins you take with your health care professional. Some people who take this medicine have severe bone, joint, and/or muscle pain. This medicine may also increase your risk for jaw problems or a broken thigh  bone. Tell your doctor right away if you have severe pain in your jaw, bones, joints, or muscles. Tell your doctor if you have any pain that does not go away or that gets worse. Tell your dentist and dental surgeon that you are taking this medicine. You should not have major dental surgery while on this medicine. See your dentist to have a dental exam and fix any dental problems before starting this medicine. Take good care of your teeth while on this medicine. Make sure you see your dentist for regular follow-up appointments. What side effects may I notice from receiving this medicine? Side effects that you should report to your doctor or health care professional as soon as possible: -allergic reactions like skin rash, itching or hives, swelling of the face, lips, or tongue -anxiety, confusion, or depression -breathing problems -changes in vision -eye pain -feeling faint or lightheaded, falls -jaw pain, especially after dental work -mouth sores -muscle cramps, stiffness, or weakness -trouble passing urine or change in the amount  of urine Side effects that usually do not require medical attention (report to your doctor or health care professional if they continue or are bothersome): -bone, joint, or muscle pain -constipation -diarrhea -fever -hair loss -irritation at site where injected -loss of appetite -nausea, vomiting -stomach upset -trouble sleeping -trouble swallowing -weak or tired This list may not describe all possible side effects. Call your doctor for medical advice about side effects. You may report side effects to FDA at 1-800-FDA-1088. Where should I keep my medicine? This drug is given in a hospital or clinic and will not be stored at home. NOTE: This sheet is a summary. It may not cover all possible information. If you have questions about this medicine, talk to your doctor, pharmacist, or health care provider.  2015, Elsevier/Gold Standard. (2012-10-28 13:03:13)

## 2014-04-11 ENCOUNTER — Telehealth: Payer: Self-pay | Admitting: *Deleted

## 2014-04-11 MED ORDER — LORAZEPAM 0.5 MG PO TABS: 0.5000 mg | ORAL_TABLET | Freq: Four times a day (QID) | ORAL | Status: DC | PRN

## 2014-04-11 NOTE — Telephone Encounter (Signed)
Felt some aches in his legs last night and slightly queezy-good now. Has no questions or concerns.

## 2014-04-11 NOTE — Telephone Encounter (Signed)
Family member called to move appt to later time in the day.Left message to call the office back

## 2014-04-12 ENCOUNTER — Telehealth: Payer: Self-pay

## 2014-04-12 NOTE — Telephone Encounter (Signed)
Called wife back after speaking

## 2014-04-12 NOTE — Telephone Encounter (Signed)
Returning wife donna's call. Pt had chills last night but no fever. Pt had HA this AM and took tylenol. Checked temp after tylenol and it was 100.4. Pt does have some SOB. Pt has chest pain - last night was under L arm pit and rib area, this AM it is more across the whole front chest. Chest is tender to touch and painful internally, may be slightly swollen. Pt used heat and ice alternating without any difference. He used dilaudid without help. He does have a fentanyl patch on. He was crying last night. He received D1C1 velcade and zometa on Monday. Pt did still go work, Architect, to check out some issues.

## 2014-04-12 NOTE — Telephone Encounter (Signed)
Called wife back after speaking with Dr Lona Kettle. Keep monitoring temp. Take ativan as needed b/c he feels there is a strong emotional/anxiety component to the pain. Continue pain meds as prescribed so pain does not get out of control. Call us back if any more questions.

## 2014-04-13 ENCOUNTER — Ambulatory Visit (HOSPITAL_BASED_OUTPATIENT_CLINIC_OR_DEPARTMENT_OTHER): Payer: BC Managed Care – PPO

## 2014-04-13 ENCOUNTER — Other Ambulatory Visit: Payer: Self-pay | Admitting: *Deleted

## 2014-04-13 DIAGNOSIS — C9000 Multiple myeloma not having achieved remission: Secondary | ICD-10-CM

## 2014-04-13 DIAGNOSIS — Z5112 Encounter for antineoplastic immunotherapy: Secondary | ICD-10-CM

## 2014-04-13 MED ORDER — ONDANSETRON HCL 8 MG PO TABS
8.0000 mg | ORAL_TABLET | Freq: Once | ORAL | Status: AC
  Administered 2014-04-13: 8 mg via ORAL

## 2014-04-13 MED ORDER — ONDANSETRON HCL 8 MG PO TABS
ORAL_TABLET | ORAL | Status: AC
  Filled 2014-04-13: qty 1

## 2014-04-13 MED ORDER — BORTEZOMIB CHEMO SQ INJECTION 3.5 MG (2.5MG/ML)
1.3000 mg/m2 | Freq: Once | INTRAMUSCULAR | Status: AC
  Administered 2014-04-13: 3 mg via SUBCUTANEOUS
  Filled 2014-04-13: qty 3

## 2014-04-14 ENCOUNTER — Telehealth: Payer: Self-pay | Admitting: *Deleted

## 2014-04-14 ENCOUNTER — Telehealth: Payer: Self-pay

## 2014-04-14 ENCOUNTER — Other Ambulatory Visit: Payer: Self-pay | Admitting: *Deleted

## 2014-04-14 ENCOUNTER — Telehealth: Payer: Self-pay | Admitting: Hematology

## 2014-04-14 DIAGNOSIS — C9000 Multiple myeloma not having achieved remission: Secondary | ICD-10-CM

## 2014-04-14 MED ORDER — LENALIDOMIDE 25 MG PO CAPS: ORAL_CAPSULE | ORAL | Status: DC

## 2014-04-14 NOTE — Telephone Encounter (Signed)
Confirm appt d/t for Nov.

## 2014-04-14 NOTE — Telephone Encounter (Signed)
Refill request for Revlimid 25 mg to MD desk for approval.

## 2014-04-14 NOTE — Telephone Encounter (Signed)
Pt called to let him know he needs to take the celgene survey

## 2014-04-14 NOTE — Telephone Encounter (Signed)
Per staff message I have adjusted appts  

## 2014-04-17 ENCOUNTER — Other Ambulatory Visit: Payer: BC Managed Care – PPO

## 2014-04-17 ENCOUNTER — Other Ambulatory Visit: Payer: Self-pay | Admitting: Hematology

## 2014-04-17 ENCOUNTER — Ambulatory Visit (HOSPITAL_BASED_OUTPATIENT_CLINIC_OR_DEPARTMENT_OTHER): Payer: BC Managed Care – PPO

## 2014-04-17 ENCOUNTER — Other Ambulatory Visit (HOSPITAL_BASED_OUTPATIENT_CLINIC_OR_DEPARTMENT_OTHER): Payer: BC Managed Care – PPO

## 2014-04-17 DIAGNOSIS — C9000 Multiple myeloma not having achieved remission: Secondary | ICD-10-CM

## 2014-04-17 DIAGNOSIS — Z5112 Encounter for antineoplastic immunotherapy: Secondary | ICD-10-CM

## 2014-04-17 LAB — CBC WITH DIFFERENTIAL/PLATELET
BASO%: 0.2 % (ref 0.0–2.0)
BASOS ABS: 0 10*3/uL (ref 0.0–0.1)
EOS ABS: 0 10*3/uL (ref 0.0–0.5)
EOS%: 0.1 % (ref 0.0–7.0)
HCT: 38.5 % (ref 38.4–49.9)
HEMOGLOBIN: 12.3 g/dL — AB (ref 13.0–17.1)
LYMPH#: 0.4 10*3/uL — AB (ref 0.9–3.3)
LYMPH%: 7.7 % — ABNORMAL LOW (ref 14.0–49.0)
MCH: 27.8 pg (ref 27.2–33.4)
MCHC: 32 g/dL (ref 32.0–36.0)
MCV: 87.1 fL (ref 79.3–98.0)
MONO#: 0.1 10*3/uL (ref 0.1–0.9)
MONO%: 1.4 % (ref 0.0–14.0)
NEUT%: 90.6 % — ABNORMAL HIGH (ref 39.0–75.0)
NEUTROS ABS: 4.9 10*3/uL (ref 1.5–6.5)
Platelets: 261 10*3/uL (ref 140–400)
RBC: 4.42 10*6/uL (ref 4.20–5.82)
RDW: 15.1 % — ABNORMAL HIGH (ref 11.0–14.6)
WBC: 5.4 10*3/uL (ref 4.0–10.3)

## 2014-04-17 MED ORDER — ONDANSETRON HCL 8 MG PO TABS
ORAL_TABLET | ORAL | Status: AC
  Filled 2014-04-17: qty 1

## 2014-04-17 MED ORDER — FENTANYL 25 MCG/HR TD PT72: 25.0000 ug | MEDICATED_PATCH | TRANSDERMAL | Status: DC

## 2014-04-17 MED ORDER — ONDANSETRON HCL 8 MG PO TABS
8.0000 mg | ORAL_TABLET | Freq: Once | ORAL | Status: AC
  Administered 2014-04-17: 8 mg via ORAL

## 2014-04-17 MED ORDER — BORTEZOMIB CHEMO SQ INJECTION 3.5 MG (2.5MG/ML)
1.3000 mg/m2 | Freq: Once | INTRAMUSCULAR | Status: AC
  Administered 2014-04-17: 3 mg via SUBCUTANEOUS
  Filled 2014-04-17: qty 3

## 2014-04-17 NOTE — Telephone Encounter (Signed)
RECEIVED A FAX FROM [facility] CONCERNING A CONFIRMATION OF FACSIMILE RECEIPT FOR PT. REFERRAL. 

## 2014-04-17 NOTE — Patient Instructions (Signed)
[facility] Discharge Instructions for Patients Receiving Chemotherapy  Today you received the following chemotherapy agents:  To help prevent nausea and vomiting after your treatment, we encourage you to take your nausea medication: zofran 8 mg every 8 hours as needed.   If you develop nausea and vomiting that is not controlled by your nausea medication, call the clinic.   BELOW ARE SYMPTOMS THAT SHOULD BE REPORTED IMMEDIATELY:  *FEVER GREATER THAN 100.5 F  *CHILLS WITH OR WITHOUT FEVER  NAUSEA AND VOMITING THAT IS NOT CONTROLLED WITH YOUR NAUSEA MEDICATION  *UNUSUAL SHORTNESS OF BREATH  *UNUSUAL BRUISING OR BLEEDING  TENDERNESS IN MOUTH AND THROAT WITH OR WITHOUT PRESENCE OF ULCERS  *URINARY PROBLEMS  *BOWEL PROBLEMS  UNUSUAL RASH Items with * indicate a potential emergency and should be followed up as soon as possible.  Feel free to call the clinic you have any questions or concerns. The clinic phone number is [phone].

## 2014-04-18 ENCOUNTER — Telehealth: Payer: Self-pay | Admitting: *Deleted

## 2014-04-18 NOTE — Telephone Encounter (Signed)
Per patient's request I have moved his appts on 11/19 to earlier in the day

## 2014-04-19 ENCOUNTER — Telehealth: Payer: Self-pay | Admitting: *Deleted

## 2014-04-19 NOTE — Telephone Encounter (Signed)
Patient called and moved appt from tomorrow to Friday

## 2014-04-20 ENCOUNTER — Other Ambulatory Visit: Payer: BC Managed Care – PPO

## 2014-04-20 ENCOUNTER — Ambulatory Visit: Payer: BC Managed Care – PPO

## 2014-04-20 ENCOUNTER — Other Ambulatory Visit: Payer: Self-pay | Admitting: Hematology

## 2014-04-20 DIAGNOSIS — C9000 Multiple myeloma not having achieved remission: Secondary | ICD-10-CM

## 2014-04-20 MED ORDER — LORAZEPAM 0.5 MG PO TABS: 0.5000 mg | ORAL_TABLET | Freq: Four times a day (QID) | ORAL | Status: DC | PRN

## 2014-04-20 NOTE — Telephone Encounter (Signed)
Called patient at 10:45 am for clarification of the lorazepam and revlimid refill request.  Reports he will receive the shipment tomorrow for Revlimid authorized on 04-14-2014 and a nurse has authorized the lorazepam today.

## 2014-04-21 ENCOUNTER — Other Ambulatory Visit (HOSPITAL_BASED_OUTPATIENT_CLINIC_OR_DEPARTMENT_OTHER): Payer: BC Managed Care – PPO

## 2014-04-21 ENCOUNTER — Ambulatory Visit (HOSPITAL_BASED_OUTPATIENT_CLINIC_OR_DEPARTMENT_OTHER): Payer: BC Managed Care – PPO

## 2014-04-21 ENCOUNTER — Telehealth: Payer: Self-pay

## 2014-04-21 DIAGNOSIS — C9000 Multiple myeloma not having achieved remission: Secondary | ICD-10-CM

## 2014-04-21 DIAGNOSIS — Z5112 Encounter for antineoplastic immunotherapy: Secondary | ICD-10-CM

## 2014-04-21 LAB — CBC WITH DIFFERENTIAL/PLATELET
BASO%: 0.3 % (ref 0.0–2.0)
BASOS ABS: 0 10*3/uL (ref 0.0–0.1)
EOS ABS: 0.2 10*3/uL (ref 0.0–0.5)
EOS%: 2.6 % (ref 0.0–7.0)
HEMATOCRIT: 36.6 % — AB (ref 38.4–49.9)
HGB: 12.2 g/dL — ABNORMAL LOW (ref 13.0–17.1)
LYMPH%: 20.9 % (ref 14.0–49.0)
MCH: 27.9 pg (ref 27.2–33.4)
MCHC: 33.3 g/dL (ref 32.0–36.0)
MCV: 83.6 fL (ref 79.3–98.0)
MONO#: 0.7 10*3/uL (ref 0.1–0.9)
MONO%: 11.6 % (ref 0.0–14.0)
NEUT%: 64.6 % (ref 39.0–75.0)
NEUTROS ABS: 3.9 10*3/uL (ref 1.5–6.5)
Platelets: 259 10*3/uL (ref 140–400)
RBC: 4.38 10*6/uL (ref 4.20–5.82)
RDW: 14 % (ref 11.0–14.6)
WBC: 6.1 10*3/uL (ref 4.0–10.3)
lymph#: 1.3 10*3/uL (ref 0.9–3.3)

## 2014-04-21 MED ORDER — BORTEZOMIB CHEMO SQ INJECTION 3.5 MG (2.5MG/ML)
1.3000 mg/m2 | Freq: Once | INTRAMUSCULAR | Status: AC
  Administered 2014-04-21: 3 mg via SUBCUTANEOUS
  Filled 2014-04-21: qty 3

## 2014-04-21 MED ORDER — ONDANSETRON HCL 8 MG PO TABS
ORAL_TABLET | ORAL | Status: AC
Start: 2014-04-21 — End: 2014-04-21
  Filled 2014-04-21: qty 1

## 2014-04-21 MED ORDER — ONDANSETRON HCL 8 MG PO TABS
8.0000 mg | ORAL_TABLET | Freq: Once | ORAL | Status: AC
  Administered 2014-04-21: 8 mg via ORAL

## 2014-04-21 NOTE — Progress Notes (Signed)
Pt. arrived for tmt today without current chemistry. Dr. Lona Kettle away from his office at this time.  Verified with pharmacist Vergia Alcon, reviewed chemistry from one month ago, stable.  Dr. Lona Kettle paged  anad made aware of this.  Last chemistry lab results reviewed with Dr. Lona Kettle over the phone,. Per md, go ahead and treat pt. today without need for chemistry lab.  Primary RN Jenny Reichmann notified of above. HL

## 2014-04-21 NOTE — Telephone Encounter (Signed)
Received a shippment notice from [facility] that the Revlimid was shipped to patient on 04-20-14.

## 2014-04-21 NOTE — Patient Instructions (Signed)
[facility] Discharge Instructions for Patients Receiving Chemotherapy  Today you received the following chemotherapy agents Velcade  To help prevent nausea and vomiting after your treatment, we encourage you to take your nausea medication Zofran as directed If you develop nausea and vomiting that is not controlled by your nausea medication, call the clinic.   BELOW ARE SYMPTOMS THAT SHOULD BE REPORTED IMMEDIATELY:  *FEVER GREATER THAN 100.5 F  *CHILLS WITH OR WITHOUT FEVER  NAUSEA AND VOMITING THAT IS NOT CONTROLLED WITH YOUR NAUSEA MEDICATION  *UNUSUAL SHORTNESS OF BREATH  *UNUSUAL BRUISING OR BLEEDING  TENDERNESS IN MOUTH AND THROAT WITH OR WITHOUT PRESENCE OF ULCERS  *URINARY PROBLEMS  *BOWEL PROBLEMS  UNUSUAL RASH Items with * indicate a potential emergency and should be followed up as soon as possible.  Feel free to call the clinic you have any questions or concerns. The clinic phone number is [phone].

## 2014-04-24 ENCOUNTER — Telehealth: Payer: Self-pay

## 2014-04-24 ENCOUNTER — Other Ambulatory Visit: Payer: BC Managed Care – PPO

## 2014-04-24 ENCOUNTER — Ambulatory Visit (HOSPITAL_BASED_OUTPATIENT_CLINIC_OR_DEPARTMENT_OTHER): Payer: BC Managed Care – PPO

## 2014-04-24 ENCOUNTER — Ambulatory Visit (HOSPITAL_BASED_OUTPATIENT_CLINIC_OR_DEPARTMENT_OTHER): Payer: BC Managed Care – PPO | Admitting: Nurse Practitioner

## 2014-04-24 ENCOUNTER — Encounter: Payer: Self-pay | Admitting: Nurse Practitioner

## 2014-04-24 ENCOUNTER — Other Ambulatory Visit: Payer: Self-pay | Admitting: *Deleted

## 2014-04-24 VITALS — BP 127/76 | HR 92 | Temp 98.3°F | Resp 18 | Ht 72.0 in | Wt 212.8 lb

## 2014-04-24 DIAGNOSIS — C7951 Secondary malignant neoplasm of bone: Secondary | ICD-10-CM | POA: Insufficient documentation

## 2014-04-24 DIAGNOSIS — M5412 Radiculopathy, cervical region: Secondary | ICD-10-CM

## 2014-04-24 DIAGNOSIS — G893 Neoplasm related pain (acute) (chronic): Secondary | ICD-10-CM

## 2014-04-24 DIAGNOSIS — C9000 Multiple myeloma not having achieved remission: Secondary | ICD-10-CM

## 2014-04-24 DIAGNOSIS — E119 Type 2 diabetes mellitus without complications: Secondary | ICD-10-CM

## 2014-04-24 LAB — URINALYSIS, MICROSCOPIC - CHCC
BILIRUBIN (URINE): NEGATIVE
Blood: NEGATIVE
Glucose: 2000 mg/dL
Ketones: 40 mg/dL
LEUKOCYTE ESTERASE: NEGATIVE
Nitrite: NEGATIVE
Protein: NEGATIVE mg/dL
RBC / HPF: NEGATIVE (ref 0–2)
Specific Gravity, Urine: 1.01 (ref 1.003–1.035)
Urobilinogen, UR: 0.2 mg/dL (ref 0.2–1)
WBC, UA: NEGATIVE (ref 0–2)
pH: 5 (ref 4.6–8.0)

## 2014-04-24 MED ORDER — LORAZEPAM 0.5 MG PO TABS: 0.5000 mg | ORAL_TABLET | Freq: Four times a day (QID) | ORAL | Status: DC | PRN

## 2014-04-24 MED ORDER — HYDROMORPHONE HCL 4 MG PO TABS: 4.0000 mg | ORAL_TABLET | Freq: Four times a day (QID) | ORAL | Status: DC | PRN

## 2014-04-24 MED ORDER — PROCHLORPERAZINE MALEATE 10 MG PO TABS: 10.0000 mg | ORAL_TABLET | Freq: Four times a day (QID) | ORAL | Status: DC | PRN
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
## 2014-04-24 NOTE — Assessment & Plan Note (Addendum)
Patient with hx of chronic neuropathy and radiculopathy for past few years; status post 2 back surgeries.  He has been taking neurontin, Fentanyl patch, and dilaudid for breakthru pain for last 18 months. Recently diagnosed with multiple lytic lesions to spine, ribs, and sternum.  C/o worsening pain to entire trunk; stating that his lower back pain increases when he urinates.  Patient denies any specific dysuria, frequency, or hesitation upon urination.  Also denies any recent fevers or chills.  Urinalysis obtained today was negative for any infection.  Pending urine culture results.  Patient was given a refill of his hydromorphone; and was also advised to increase his hydromorphone to 4 mg every 6 hours.  Patient was also given a refill of his lorazepam and Compazine as well.  Also, patient was advised not to take the lorazepam when taking the diazepam; since it will increase sedation affect.  Patient stated understanding of all instructions.

## 2014-04-24 NOTE — Assessment & Plan Note (Signed)
Patient initiated cycle 1 of his RVD chemotherapy regimen on 04/10/2014.  He is scheduled for cycle 2 of the same regimen on 05/02/14.

## 2014-04-24 NOTE — Assessment & Plan Note (Signed)
Patient has been diagnosed with bone metastasis.  He will continue to receive Zometa on an every month basis.

## 2014-04-24 NOTE — Telephone Encounter (Signed)
Wife called stating triage nurse last night said he would be seen today. Called Kristy on his mobile and told him to get here ASAP. Urgent POF to scheduler

## 2014-04-24 NOTE — Assessment & Plan Note (Signed)
Patient Dylan Benson diagnosed with diabetes.  Currently takes metformin as prescribed per his primary care provider.  Urinalysis revealed a glucose of 2000 and ketones 40.  Last blood sugar documented in chart was 200.  Will call patient first thing in the morning to review his recent  Fingerstick blood sugar results.  Most likely, the prednisone the patient is taking as part of his chemotherapy regimen is increasing his blood sugar levels.  He may need tighter control of his diabetes while undergoing this regimen.  Will also call patient's per rectum provider; and update them regarding this plan as well.

## 2014-04-25 LAB — URINE CULTURE

## 2014-04-26 ENCOUNTER — Ambulatory Visit (HOSPITAL_BASED_OUTPATIENT_CLINIC_OR_DEPARTMENT_OTHER): Payer: BC Managed Care – PPO | Admitting: Hematology

## 2014-04-26 ENCOUNTER — Telehealth: Payer: Self-pay | Admitting: Hematology and Oncology

## 2014-04-26 ENCOUNTER — Telehealth: Payer: Self-pay | Admitting: Nurse Practitioner

## 2014-04-26 ENCOUNTER — Encounter: Payer: Self-pay | Admitting: Hematology

## 2014-04-26 ENCOUNTER — Other Ambulatory Visit (HOSPITAL_BASED_OUTPATIENT_CLINIC_OR_DEPARTMENT_OTHER): Payer: BC Managed Care – PPO

## 2014-04-26 VITALS — BP 123/77 | HR 66 | Temp 97.9°F | Resp 18 | Ht 72.0 in | Wt 214.5 lb

## 2014-04-26 DIAGNOSIS — C9000 Multiple myeloma not having achieved remission: Secondary | ICD-10-CM

## 2014-04-26 LAB — CBC WITH DIFFERENTIAL/PLATELET
BASO%: 1.1 % (ref 0.0–2.0)
Basophils Absolute: 0 10*3/uL (ref 0.0–0.1)
EOS%: 3.6 % (ref 0.0–7.0)
Eosinophils Absolute: 0.1 10*3/uL (ref 0.0–0.5)
HCT: 36.5 % — ABNORMAL LOW (ref 38.4–49.9)
HGB: 12.2 g/dL — ABNORMAL LOW (ref 13.0–17.1)
LYMPH#: 1.6 10*3/uL (ref 0.9–3.3)
LYMPH%: 42.6 % (ref 14.0–49.0)
MCH: 28.1 pg (ref 27.2–33.4)
MCHC: 33.5 g/dL (ref 32.0–36.0)
MCV: 83.8 fL (ref 79.3–98.0)
MONO#: 0.4 10*3/uL (ref 0.1–0.9)
MONO%: 11.3 % (ref 0.0–14.0)
NEUT#: 1.6 10*3/uL (ref 1.5–6.5)
NEUT%: 41.4 % (ref 39.0–75.0)
Platelets: 232 10*3/uL (ref 140–400)
RBC: 4.36 10*6/uL (ref 4.20–5.82)
RDW: 15.3 % — ABNORMAL HIGH (ref 11.0–14.6)
WBC: 3.8 10*3/uL — ABNORMAL LOW (ref 4.0–10.3)

## 2014-04-26 LAB — COMPREHENSIVE METABOLIC PANEL (CC13)
ALT: 54 U/L (ref 0–55)
AST: 55 U/L — AB (ref 5–34)
Albumin: 3.1 g/dL — ABNORMAL LOW (ref 3.5–5.0)
Alkaline Phosphatase: 109 U/L (ref 40–150)
Anion Gap: 11 mEq/L (ref 3–11)
BILIRUBIN TOTAL: 0.3 mg/dL (ref 0.20–1.20)
BUN: 17.3 mg/dL (ref 7.0–26.0)
CALCIUM: 9 mg/dL (ref 8.4–10.4)
CHLORIDE: 97 meq/L — AB (ref 98–109)
CO2: 24 meq/L (ref 22–29)
Creatinine: 0.9 mg/dL (ref 0.7–1.3)
Glucose: 301 mg/dl — ABNORMAL HIGH (ref 70–140)
Potassium: 3.6 mEq/L (ref 3.5–5.1)
Sodium: 132 mEq/L — ABNORMAL LOW (ref 136–145)
Total Protein: 7.9 g/dL (ref 6.4–8.3)

## 2014-04-26 MED ORDER — DEXAMETHASONE 4 MG PO TABS
ORAL_TABLET | ORAL | Status: DC
Start: 2014-04-26 — End: 2014-06-15

## 2014-04-26 NOTE — Telephone Encounter (Signed)
Gv pt appt schedule for nov/dec. referral message to HIM re appt w/dr powell at [facility]. pt aware he will be contacted re appt w/dr powell. pt transferred to from [facility] to NG.

## 2014-04-26 NOTE — Telephone Encounter (Signed)
Per 11/23 POF chemo to be added, sent msg to add chemo .Marland Kitchen... KJ
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
## 2014-05-01 ENCOUNTER — Telehealth: Payer: Self-pay

## 2014-05-01 ENCOUNTER — Other Ambulatory Visit: Payer: BC Managed Care – PPO

## 2014-05-01 ENCOUNTER — Other Ambulatory Visit (HOSPITAL_BASED_OUTPATIENT_CLINIC_OR_DEPARTMENT_OTHER): Payer: BC Managed Care – PPO

## 2014-05-01 ENCOUNTER — Other Ambulatory Visit: Payer: Self-pay | Admitting: Hematology and Oncology

## 2014-05-01 ENCOUNTER — Telehealth: Payer: Self-pay | Admitting: Hematology and Oncology

## 2014-05-01 ENCOUNTER — Ambulatory Visit: Payer: BC Managed Care – PPO

## 2014-05-01 ENCOUNTER — Ambulatory Visit (HOSPITAL_BASED_OUTPATIENT_CLINIC_OR_DEPARTMENT_OTHER): Payer: BC Managed Care – PPO

## 2014-05-01 DIAGNOSIS — Z5112 Encounter for antineoplastic immunotherapy: Secondary | ICD-10-CM

## 2014-05-01 DIAGNOSIS — C9000 Multiple myeloma not having achieved remission: Secondary | ICD-10-CM

## 2014-05-01 LAB — CBC WITH DIFFERENTIAL/PLATELET
BASO%: 0.7 % (ref 0.0–2.0)
Basophils Absolute: 0 10*3/uL (ref 0.0–0.1)
EOS ABS: 0 10*3/uL (ref 0.0–0.5)
EOS%: 0 % (ref 0.0–7.0)
HCT: 42.1 % (ref 38.4–49.9)
HGB: 13.2 g/dL (ref 13.0–17.1)
LYMPH%: 13.5 % — ABNORMAL LOW (ref 14.0–49.0)
MCH: 27.5 pg (ref 27.2–33.4)
MCHC: 31.4 g/dL — ABNORMAL LOW (ref 32.0–36.0)
MCV: 87.5 fL (ref 79.3–98.0)
MONO#: 0.1 10*3/uL (ref 0.1–0.9)
MONO%: 1.4 % (ref 0.0–14.0)
NEUT#: 3.8 10*3/uL (ref 1.5–6.5)
NEUT%: 84.4 % — ABNORMAL HIGH (ref 39.0–75.0)
PLATELETS: 297 10*3/uL (ref 140–400)
RBC: 4.81 10*6/uL (ref 4.20–5.82)
RDW: 15.3 % — AB (ref 11.0–14.6)
WBC: 4.5 10*3/uL (ref 4.0–10.3)
lymph#: 0.6 10*3/uL — ABNORMAL LOW (ref 0.9–3.3)

## 2014-05-01 LAB — SPEP & IFE WITH QIG
ALBUMIN ELP: 50.8 % — AB (ref 55.8–66.1)
ALPHA-1-GLOBULIN: 4.1 % (ref 2.9–4.9)
ALPHA-2-GLOBULIN: 9.3 % (ref 7.1–11.8)
Beta 2: 5 % (ref 3.2–6.5)
Beta Globulin: 5.5 % (ref 4.7–7.2)
Gamma Globulin: 25.3 % — ABNORMAL HIGH (ref 11.1–18.8)
IGM, SERUM: 43 mg/dL (ref 41–251)
IgA: 205 mg/dL (ref 68–379)
IgG (Immunoglobin G), Serum: 2240 mg/dL — ABNORMAL HIGH (ref 650–1600)
M-Spike, %: 1.5 g/dL
Total Protein, Serum Electrophoresis: 7.3 g/dL (ref 6.0–8.3)

## 2014-05-01 LAB — COMPREHENSIVE METABOLIC PANEL (CC13)
ALBUMIN: 3.6 g/dL (ref 3.5–5.0)
ALT: 61 U/L — ABNORMAL HIGH (ref 0–55)
ANION GAP: 17 meq/L — AB (ref 3–11)
AST: 51 U/L — ABNORMAL HIGH (ref 5–34)
Alkaline Phosphatase: 137 U/L (ref 40–150)
BUN: 15.3 mg/dL (ref 7.0–26.0)
CO2: 15 meq/L — AB (ref 22–29)
Calcium: 10.2 mg/dL (ref 8.4–10.4)
Chloride: 93 mEq/L — ABNORMAL LOW (ref 98–109)
Creatinine: 1.4 mg/dL — ABNORMAL HIGH (ref 0.7–1.3)
POTASSIUM: 5 meq/L (ref 3.5–5.1)
SODIUM: 125 meq/L — AB (ref 136–145)
TOTAL PROTEIN: 8.9 g/dL — AB (ref 6.4–8.3)
Total Bilirubin: 0.61 mg/dL (ref 0.20–1.20)

## 2014-05-01 LAB — KAPPA/LAMBDA LIGHT CHAINS
KAPPA FREE LGHT CHN: 7.44 mg/dL — AB (ref 0.33–1.94)
KAPPA LAMBDA RATIO: 16.91 — AB (ref 0.26–1.65)
Lambda Free Lght Chn: 0.44 mg/dL — ABNORMAL LOW (ref 0.57–2.63)

## 2014-05-01 MED ORDER — BORTEZOMIB CHEMO SQ INJECTION 3.5 MG (2.5MG/ML)
1.3000 mg/m2 | Freq: Once | INTRAMUSCULAR | Status: AC
  Administered 2014-05-01: 3 mg via SUBCUTANEOUS
  Filled 2014-05-01: qty 3

## 2014-05-01 MED ORDER — ONDANSETRON HCL 8 MG PO TABS
ORAL_TABLET | ORAL | Status: AC
  Filled 2014-05-01: qty 1

## 2014-05-01 MED ORDER — ONDANSETRON HCL 8 MG PO TABS
8.0000 mg | ORAL_TABLET | Freq: Once | ORAL | Status: AC
Start: 2014-05-01 — End: 2014-05-01
  Administered 2014-05-01: 8 mg via ORAL

## 2014-05-01 NOTE — Telephone Encounter (Signed)
Called pt lvm in ref to appt.with Dr. Cassell Clement at [facility] on 05/17/14@12 :70. Medical records faxed. Slides and scans will be fedex'ed

## 2014-05-01 NOTE — Progress Notes (Signed)
Dr Alvy Bimler notified of lab values.  MD spoke to pt in infusion.  Ok to continue velcade treatment today.

## 2014-05-01 NOTE — Telephone Encounter (Signed)
I spoke with the patient briefly at the infusion center regarding his high blood sugar. He admitted that he has been noncompliant with diabetic diet. He is not symptomatic. I recommend slight insulin dose titration and to increase oral fluid intake.

## 2014-05-01 NOTE — Patient Instructions (Signed)
[facility] Discharge Instructions for Patients Receiving Chemotherapy  Today you received the following chemotherapy agents velcade   To help prevent nausea and vomiting after your treatment, we encourage you to take your nausea medication as directed  If you develop nausea and vomiting that is not controlled by your nausea medication, call the clinic.   BELOW ARE SYMPTOMS THAT SHOULD BE REPORTED IMMEDIATELY:  *FEVER GREATER THAN 100.5 F  *CHILLS WITH OR WITHOUT FEVER  NAUSEA AND VOMITING THAT IS NOT CONTROLLED WITH YOUR NAUSEA MEDICATION  *UNUSUAL SHORTNESS OF BREATH  *UNUSUAL BRUISING OR BLEEDING  TENDERNESS IN MOUTH AND THROAT WITH OR WITHOUT PRESENCE OF ULCERS  *URINARY PROBLEMS  *BOWEL PROBLEMS  UNUSUAL RASH Items with * indicate a potential emergency and should be followed up as soon as possible.  Feel free to call the clinic you have any questions or concerns. The clinic phone number is [phone].  

## 2014-05-01 NOTE — Progress Notes (Signed)
I saw the patient at the infusion center briefly. I was notified that his blood sugar has been running high, at 741 with elevated creatinine. The patient admitted to be noncompliant with diabetic diet and has been drinking sugary soda prior to the blood being drawn. He is not symptomatic. He is recently started on insulin therapy. I recommend slight dose titration of his home insulin dose and recommend he stays off all sugary drinks until his next lab draw.

## 2014-05-03 ENCOUNTER — Other Ambulatory Visit: Payer: Self-pay | Admitting: Hematology

## 2014-05-03 DIAGNOSIS — C9000 Multiple myeloma not having achieved remission: Secondary | ICD-10-CM

## 2014-05-04 ENCOUNTER — Other Ambulatory Visit (HOSPITAL_BASED_OUTPATIENT_CLINIC_OR_DEPARTMENT_OTHER): Payer: BC Managed Care – PPO

## 2014-05-04 ENCOUNTER — Ambulatory Visit (HOSPITAL_BASED_OUTPATIENT_CLINIC_OR_DEPARTMENT_OTHER): Payer: BC Managed Care – PPO

## 2014-05-04 DIAGNOSIS — C9000 Multiple myeloma not having achieved remission: Secondary | ICD-10-CM

## 2014-05-04 DIAGNOSIS — Z5112 Encounter for antineoplastic immunotherapy: Secondary | ICD-10-CM

## 2014-05-04 LAB — COMPREHENSIVE METABOLIC PANEL (CC13)
ALT: 51 U/L (ref 0–55)
ANION GAP: 10 meq/L (ref 3–11)
AST: 47 U/L — ABNORMAL HIGH (ref 5–34)
Albumin: 3.4 g/dL — ABNORMAL LOW (ref 3.5–5.0)
Alkaline Phosphatase: 129 U/L (ref 40–150)
BUN: 13.4 mg/dL (ref 7.0–26.0)
CALCIUM: 9.1 mg/dL (ref 8.4–10.4)
CHLORIDE: 99 meq/L (ref 98–109)
CO2: 23 meq/L (ref 22–29)
Creatinine: 1 mg/dL (ref 0.7–1.3)
EGFR: 88 mL/min/{1.73_m2} — ABNORMAL LOW (ref >90)
Glucose: 191 mg/dl — ABNORMAL HIGH (ref 70–140)
Potassium: 3.7 mEq/L (ref 3.5–5.1)
SODIUM: 133 meq/L — AB (ref 136–145)
TOTAL PROTEIN: 7.9 g/dL (ref 6.4–8.3)
Total Bilirubin: 0.37 mg/dL (ref 0.20–1.20)

## 2014-05-04 LAB — CBC WITH DIFFERENTIAL/PLATELET
BASO%: 2.2 % — AB (ref 0.0–2.0)
Basophils Absolute: 0.1 10*3/uL (ref 0.0–0.1)
EOS%: 2.1 % (ref 0.0–7.0)
Eosinophils Absolute: 0.1 10*3/uL (ref 0.0–0.5)
HCT: 38.9 % (ref 38.4–49.9)
HGB: 12.6 g/dL — ABNORMAL LOW (ref 13.0–17.1)
LYMPH%: 40.7 % (ref 14.0–49.0)
MCH: 27.6 pg (ref 27.2–33.4)
MCHC: 32.4 g/dL (ref 32.0–36.0)
MCV: 85.2 fL (ref 79.3–98.0)
MONO#: 0.5 10*3/uL (ref 0.1–0.9)
MONO%: 11.9 % (ref 0.0–14.0)
NEUT#: 1.6 10*3/uL (ref 1.5–6.5)
NEUT%: 43.1 % (ref 39.0–75.0)
Platelets: 251 10*3/uL (ref 140–400)
RBC: 4.56 10*6/uL (ref 4.20–5.82)
RDW: 15.3 % — ABNORMAL HIGH (ref 11.0–14.6)
WBC: 3.8 10*3/uL — AB (ref 4.0–10.3)
lymph#: 1.5 10*3/uL (ref 0.9–3.3)

## 2014-05-04 MED ORDER — ONDANSETRON HCL 8 MG PO TABS
ORAL_TABLET | ORAL | Status: AC
  Filled 2014-05-04: qty 1

## 2014-05-04 MED ORDER — BORTEZOMIB CHEMO SQ INJECTION 3.5 MG (2.5MG/ML)
1.3000 mg/m2 | Freq: Once | INTRAMUSCULAR | Status: AC
  Administered 2014-05-04: 3 mg via SUBCUTANEOUS
  Filled 2014-05-04: qty 3

## 2014-05-04 MED ORDER — ONDANSETRON HCL 8 MG PO TABS
8.0000 mg | ORAL_TABLET | Freq: Once | ORAL | Status: AC
  Administered 2014-05-04: 8 mg via ORAL

## 2014-05-04 MED ORDER — FENTANYL 25 MCG/HR TD PT72: 25.0000 ug | MEDICATED_PATCH | TRANSDERMAL | Status: DC

## 2014-05-08 ENCOUNTER — Ambulatory Visit (HOSPITAL_BASED_OUTPATIENT_CLINIC_OR_DEPARTMENT_OTHER): Payer: BC Managed Care – PPO

## 2014-05-08 ENCOUNTER — Other Ambulatory Visit (HOSPITAL_BASED_OUTPATIENT_CLINIC_OR_DEPARTMENT_OTHER): Payer: BC Managed Care – PPO

## 2014-05-08 DIAGNOSIS — C9000 Multiple myeloma not having achieved remission: Secondary | ICD-10-CM

## 2014-05-08 DIAGNOSIS — Z5112 Encounter for antineoplastic immunotherapy: Secondary | ICD-10-CM

## 2014-05-08 DIAGNOSIS — M899 Disorder of bone, unspecified: Secondary | ICD-10-CM

## 2014-05-08 LAB — CBC WITH DIFFERENTIAL/PLATELET
BASO%: 0.2 % (ref 0.0–2.0)
Basophils Absolute: 0 10*3/uL (ref 0.0–0.1)
EOS%: 0.1 % (ref 0.0–7.0)
Eosinophils Absolute: 0 10*3/uL (ref 0.0–0.5)
HCT: 40.3 % (ref 38.4–49.9)
HGB: 13 g/dL (ref 13.0–17.1)
LYMPH%: 8 % — AB (ref 14.0–49.0)
MCH: 27.4 pg (ref 27.2–33.4)
MCHC: 32.4 g/dL (ref 32.0–36.0)
MCV: 84.5 fL (ref 79.3–98.0)
MONO#: 0.1 10*3/uL (ref 0.1–0.9)
MONO%: 1.1 % (ref 0.0–14.0)
NEUT#: 5.3 10*3/uL (ref 1.5–6.5)
NEUT%: 90.6 % — AB (ref 39.0–75.0)
PLATELETS: 201 10*3/uL (ref 140–400)
RBC: 4.76 10*6/uL (ref 4.20–5.82)
RDW: 15.7 % — ABNORMAL HIGH (ref 11.0–14.6)
WBC: 5.8 10*3/uL (ref 4.0–10.3)
lymph#: 0.5 10*3/uL — ABNORMAL LOW (ref 0.9–3.3)

## 2014-05-08 LAB — COMPREHENSIVE METABOLIC PANEL (CC13)
ALT: 69 U/L — AB (ref 0–55)
ANION GAP: 12 meq/L — AB (ref 3–11)
AST: 49 U/L — ABNORMAL HIGH (ref 5–34)
Albumin: 3.7 g/dL (ref 3.5–5.0)
Alkaline Phosphatase: 136 U/L (ref 40–150)
BILIRUBIN TOTAL: 0.77 mg/dL (ref 0.20–1.20)
BUN: 12.9 mg/dL (ref 7.0–26.0)
CO2: 20 meq/L — AB (ref 22–29)
CREATININE: 1 mg/dL (ref 0.7–1.3)
Calcium: 9.9 mg/dL (ref 8.4–10.4)
Chloride: 101 mEq/L (ref 98–109)
EGFR: >90 mL/min/{1.73_m2} (ref >90)
Glucose: 296 mg/dl — ABNORMAL HIGH (ref 70–140)
Potassium: 4.2 mEq/L (ref 3.5–5.1)
Sodium: 133 mEq/L — ABNORMAL LOW (ref 136–145)
Total Protein: 8.2 g/dL (ref 6.4–8.3)

## 2014-05-08 MED ORDER — SODIUM CHLORIDE 0.9 % IV SOLN
Freq: Once | INTRAVENOUS | Status: AC
  Administered 2014-05-08: 15:00:00 via INTRAVENOUS

## 2014-05-08 MED ORDER — ONDANSETRON HCL 8 MG PO TABS
ORAL_TABLET | ORAL | Status: AC
  Filled 2014-05-08: qty 1

## 2014-05-08 MED ORDER — ZOLEDRONIC ACID 4 MG/100ML IV SOLN
4.0000 mg | Freq: Once | INTRAVENOUS | Status: AC
  Administered 2014-05-08: 4 mg via INTRAVENOUS
  Filled 2014-05-08: qty 100

## 2014-05-08 MED ORDER — ONDANSETRON HCL 8 MG PO TABS
8.0000 mg | ORAL_TABLET | Freq: Once | ORAL | Status: AC
  Administered 2014-05-08: 8 mg via ORAL

## 2014-05-08 MED ORDER — BORTEZOMIB CHEMO SQ INJECTION 3.5 MG (2.5MG/ML)
1.3000 mg/m2 | Freq: Once | INTRAMUSCULAR | Status: AC
  Administered 2014-05-08: 3 mg via SUBCUTANEOUS
  Filled 2014-05-08: qty 3

## 2014-05-08 NOTE — Patient Instructions (Signed)
Zoledronic Acid injection (Hypercalcemia, Oncology) What is this medicine? ZOLEDRONIC ACID (ZOE le dron ik AS id) lowers the amount of calcium loss from bone. It is used to treat too much calcium in your blood from cancer. It is also used to prevent complications of cancer that has spread to the bone. This medicine may be used for other purposes; ask your health care provider or pharmacist if you have questions. COMMON BRAND NAME(S): Zometa What should I tell my health care provider before I take this medicine? They need to know if you have any of these conditions: -aspirin-sensitive asthma -cancer, especially if you are receiving medicines used to treat cancer -dental disease or wear dentures -infection -kidney disease -receiving corticosteroids like dexamethasone or prednisone -an unusual or allergic reaction to zoledronic acid, other medicines, foods, dyes, or preservatives -pregnant or trying to get pregnant -breast-feeding How should I use this medicine? This medicine is for infusion into a vein. It is given by a health care professional in a hospital or clinic setting. Talk to your pediatrician regarding the use of this medicine in children. Special care may be needed. Overdosage: If you think you have taken too much of this medicine contact a poison control center or emergency room at once. NOTE: This medicine is only for you. Do not share this medicine with others. What if I miss a dose? It is important not to miss your dose. Call your doctor or health care professional if you are unable to keep an appointment. What may interact with this medicine? -certain antibiotics given by injection -NSAIDs, medicines for pain and inflammation, like ibuprofen or naproxen -some diuretics like bumetanide, furosemide -teriparatide -thalidomide This list may not describe all possible interactions. Give your health care provider a list of all the medicines, herbs, non-prescription drugs, or  dietary supplements you use. Also tell them if you smoke, drink alcohol, or use illegal drugs. Some items may interact with your medicine. What should I watch for while using this medicine? Visit your doctor or health care professional for regular checkups. It may be some time before you see the benefit from this medicine. Do not stop taking your medicine unless your doctor tells you to. Your doctor may order blood tests or other tests to see how you are doing. Women should inform their doctor if they wish to become pregnant or think they might be pregnant. There is a potential for serious side effects to an unborn child. Talk to your health care professional or pharmacist for more information. You should make sure that you get enough calcium and vitamin D while you are taking this medicine. Discuss the foods you eat and the vitamins you take with your health care professional. Some people who take this medicine have severe bone, joint, and/or muscle pain. This medicine may also increase your risk for jaw problems or a broken thigh bone. Tell your doctor right away if you have severe pain in your jaw, bones, joints, or muscles. Tell your doctor if you have any pain that does not go away or that gets worse. Tell your dentist and dental surgeon that you are taking this medicine. You should not have major dental surgery while on this medicine. See your dentist to have a dental exam and fix any dental problems before starting this medicine. Take good care of your teeth while on this medicine. Make sure you see your dentist for regular follow-up appointments. What side effects may I notice from receiving this medicine? Side effects that   you should report to your doctor or health care professional as soon as possible: -allergic reactions like skin rash, itching or hives, swelling of the face, lips, or tongue -anxiety, confusion, or depression -breathing problems -changes in vision -eye pain -feeling faint or  lightheaded, falls -jaw pain, especially after dental work -mouth sores -muscle cramps, stiffness, or weakness -trouble passing urine or change in the amount of urine Side effects that usually do not require medical attention (report to your doctor or health care professional if they continue or are bothersome): -bone, joint, or muscle pain -constipation -diarrhea -fever -hair loss -irritation at site where injected -loss of appetite -nausea, vomiting -stomach upset -trouble sleeping -trouble swallowing -weak or tired This list may not describe all possible side effects. Call your doctor for medical advice about side effects. You may report side effects to FDA at 1-800-FDA-1088. Where should I keep my medicine? This drug is given in a hospital or clinic and will not be stored at home. NOTE: This sheet is a summary. It may not cover all possible information. If you have questions about this medicine, talk to your doctor, pharmacist, or health care provider.  2015, [employer] Standard. (2012-10-28 13:03:13)  Bortezomib injection What is this medicine? BORTEZOMIB (bor TEZ oh mib) is a chemotherapy drug. It slows the growth of cancer cells. This medicine is used to treat multiple myeloma, and certain lymphomas, such as mantle-cell lymphoma. This medicine may be used for other purposes; ask your health care provider or pharmacist if you have questions. COMMON BRAND NAME(S): Velcade What should I tell my health care provider before I take this medicine? They need to know if you have any of these conditions: -diabetes -heart disease -irregular heartbeat -liver disease -on hemodialysis -low blood counts, like low white blood cells, platelets, or hemoglobin -peripheral neuropathy -taking medicine for blood pressure -an unusual or allergic reaction to bortezomib, mannitol, boron, other medicines, foods, dyes, or preservatives -pregnant or trying to get pregnant -breast-feeding How  should I use this medicine? This medicine is for injection into a vein or for injection under the skin. It is given by a health care professional in a hospital or clinic setting. Talk to your pediatrician regarding the use of this medicine in children. Special care may be needed. Overdosage: If you think you have taken too much of this medicine contact a poison control center or emergency room at once. NOTE: This medicine is only for you. Do not share this medicine with others. What if I miss a dose? It is important not to miss your dose. Call your doctor or health care professional if you are unable to keep an appointment. What may interact with this medicine? This medicine may interact with the following medications: -ketoconazole -rifampin -ritonavir -St. John's Wort This list may not describe all possible interactions. Give your health care provider a list of all the medicines, herbs, non-prescription drugs, or dietary supplements you use. Also tell them if you smoke, drink alcohol, or use illegal drugs. Some items may interact with your medicine. What should I watch for while using this medicine? Visit your doctor for checks on your progress. This drug may make you feel generally unwell. This is not uncommon, as chemotherapy can affect healthy cells as well as cancer cells. Report any side effects. Continue your course of treatment even though you feel ill unless your doctor tells you to stop. You may get drowsy or dizzy. Do not drive, use machinery, or do anything that needs mental   alertness until you know how this medicine affects you. Do not stand or sit up quickly, especially if you are an older patient. This reduces the risk of dizzy or fainting spells. In some cases, you may be given additional medicines to help with side effects. Follow all directions for their use. Call your doctor or health care professional for advice if you get a fever, chills or sore throat, or other symptoms of a  cold or flu. Do not treat yourself. This drug decreases your body's ability to fight infections. Try to avoid being around people who are sick. This medicine may increase your risk to bruise or bleed. Call your doctor or health care professional if you notice any unusual bleeding. You may need blood work done while you are taking this medicine. In some patients, this medicine may cause a serious brain infection that may cause death. If you have any problems seeing, thinking, speaking, walking, or standing, tell your doctor right away. If you cannot reach your doctor, urgently seek other source of medical care. Do not become pregnant while taking this medicine. Women should inform their doctor if they wish to become pregnant or think they might be pregnant. There is a potential for serious side effects to an unborn child. Talk to your health care professional or pharmacist for more information. Do not breast-feed an infant while taking this medicine. Check with your doctor or health care professional if you get an attack of severe diarrhea, nausea and vomiting, or if you sweat a lot. The loss of too much body fluid can make it dangerous for you to take this medicine. What side effects may I notice from receiving this medicine? Side effects that you should report to your doctor or health care professional as soon as possible: -allergic reactions like skin rash, itching or hives, swelling of the face, lips, or tongue -breathing problems -changes in hearing -changes in vision -fast, irregular heartbeat -feeling faint or lightheaded, falls -pain, tingling, numbness in the hands or feet -right upper belly pain -seizures -swelling of the ankles, feet, hands -unusual bleeding or bruising -unusually weak or tired -vomiting -yellowing of the eyes or skin Side effects that usually do not require medical attention (report to your doctor or health care professional if they continue or are  bothersome): -changes in emotions or moods -constipation -diarrhea -loss of appetite -headache -irritation at site where injected -nausea This list may not describe all possible side effects. Call your doctor for medical advice about side effects. You may report side effects to FDA at 1-800-FDA-1088. Where should I keep my medicine? This drug is given in a hospital or clinic and will not be stored at home. NOTE: This sheet is a summary. It may not cover all possible information. If you have questions about this medicine, talk to your doctor, pharmacist, or health care provider.  2015, [employer] Standard. (2013-03-14 12:46:32)   

## 2014-05-10 LAB — KAPPA/LAMBDA LIGHT CHAINS
KAPPA LAMBDA RATIO: 6.4 — AB (ref 0.26–1.65)
Kappa free light chain: 10.5 mg/dL — ABNORMAL HIGH (ref 0.33–1.94)
Lambda Free Lght Chn: 1.64 mg/dL (ref 0.57–2.63)

## 2014-05-10 LAB — SPEP & IFE WITH QIG
ALBUMIN ELP: 50.6 % — AB (ref 55.8–66.1)
Alpha-1-Globulin: 4.2 % (ref 2.9–4.9)
Alpha-2-Globulin: 8.9 % (ref 7.1–11.8)
BETA 2: 5.8 % (ref 3.2–6.5)
Beta Globulin: 6.9 % (ref 4.7–7.2)
Gamma Globulin: 23.6 % — ABNORMAL HIGH (ref 11.1–18.8)
IGA: 241 mg/dL (ref 68–379)
IGG (IMMUNOGLOBIN G), SERUM: 2310 mg/dL — AB (ref 650–1600)
IGM, SERUM: 45 mg/dL (ref 41–251)
M-SPIKE, %: 1.54 g/dL
Total Protein, Serum Electrophoresis: 8.2 g/dL (ref 6.0–8.3)

## 2014-05-11 ENCOUNTER — Other Ambulatory Visit (HOSPITAL_BASED_OUTPATIENT_CLINIC_OR_DEPARTMENT_OTHER): Payer: BC Managed Care – PPO

## 2014-05-11 ENCOUNTER — Ambulatory Visit (HOSPITAL_BASED_OUTPATIENT_CLINIC_OR_DEPARTMENT_OTHER): Payer: BC Managed Care – PPO

## 2014-05-11 DIAGNOSIS — C9000 Multiple myeloma not having achieved remission: Secondary | ICD-10-CM

## 2014-05-11 DIAGNOSIS — Z5112 Encounter for antineoplastic immunotherapy: Secondary | ICD-10-CM

## 2014-05-11 LAB — COMPREHENSIVE METABOLIC PANEL (CC13)
ALK PHOS: 114 U/L (ref 40–150)
ALT: 59 U/L — AB (ref 0–55)
AST: 44 U/L — ABNORMAL HIGH (ref 5–34)
Albumin: 3.3 g/dL — ABNORMAL LOW (ref 3.5–5.0)
Anion Gap: 10 mEq/L (ref 3–11)
BUN: 8.9 mg/dL (ref 7.0–26.0)
CO2: 22 mEq/L (ref 22–29)
CREATININE: 0.8 mg/dL (ref 0.7–1.3)
Calcium: 8.7 mg/dL (ref 8.4–10.4)
Chloride: 104 mEq/L (ref 98–109)
EGFR: >90 mL/min/{1.73_m2} (ref >90)
Glucose: 188 mg/dl — ABNORMAL HIGH (ref 70–140)
Potassium: 3.7 mEq/L (ref 3.5–5.1)
SODIUM: 137 meq/L (ref 136–145)
TOTAL PROTEIN: 7.1 g/dL (ref 6.4–8.3)
Total Bilirubin: 0.53 mg/dL (ref 0.20–1.20)

## 2014-05-11 LAB — CBC WITH DIFFERENTIAL/PLATELET
BASO%: 1.2 % (ref 0.0–2.0)
Basophils Absolute: 0.1 10*3/uL (ref 0.0–0.1)
EOS%: 2.3 % (ref 0.0–7.0)
Eosinophils Absolute: 0.1 10*3/uL (ref 0.0–0.5)
HCT: 36.4 % — ABNORMAL LOW (ref 38.4–49.9)
HGB: 11.7 g/dL — ABNORMAL LOW (ref 13.0–17.1)
LYMPH%: 30.2 % (ref 14.0–49.0)
MCH: 27.2 pg (ref 27.2–33.4)
MCHC: 32.2 g/dL (ref 32.0–36.0)
MCV: 84.6 fL (ref 79.3–98.0)
MONO#: 0.6 10*3/uL (ref 0.1–0.9)
MONO%: 12.9 % (ref 0.0–14.0)
NEUT#: 2.4 10*3/uL (ref 1.5–6.5)
NEUT%: 53.4 % (ref 39.0–75.0)
Platelets: 162 10*3/uL (ref 140–400)
RBC: 4.31 10*6/uL (ref 4.20–5.82)
RDW: 15.6 % — ABNORMAL HIGH (ref 11.0–14.6)
WBC: 4.4 10*3/uL (ref 4.0–10.3)
lymph#: 1.3 10*3/uL (ref 0.9–3.3)

## 2014-05-11 MED ORDER — BORTEZOMIB CHEMO SQ INJECTION 3.5 MG (2.5MG/ML)
1.3000 mg/m2 | Freq: Once | INTRAMUSCULAR | Status: AC
  Administered 2014-05-11: 3 mg via SUBCUTANEOUS
  Filled 2014-05-11: qty 3

## 2014-05-11 MED ORDER — ONDANSETRON HCL 8 MG PO TABS
ORAL_TABLET | ORAL | Status: AC
  Filled 2014-05-11: qty 1

## 2014-05-11 MED ORDER — ONDANSETRON HCL 8 MG PO TABS
8.0000 mg | ORAL_TABLET | Freq: Once | ORAL | Status: AC
  Administered 2014-05-11: 8 mg via ORAL

## 2014-05-11 NOTE — Patient Instructions (Signed)
[facility] Discharge Instructions for Patients Receiving Chemotherapy  Today you received the following chemotherapy agents: Velcade  To help prevent nausea and vomiting after your treatment, we encourage you to take your nausea medication:  Compazine 10 mg every 6 hours.   If you develop nausea and vomiting that is not controlled by your nausea medication, call the clinic.   BELOW ARE SYMPTOMS THAT SHOULD BE REPORTED IMMEDIATELY:  *FEVER GREATER THAN 100.5 F  *CHILLS WITH OR WITHOUT FEVER  NAUSEA AND VOMITING THAT IS NOT CONTROLLED WITH YOUR NAUSEA MEDICATION  *UNUSUAL SHORTNESS OF BREATH  *UNUSUAL BRUISING OR BLEEDING  TENDERNESS IN MOUTH AND THROAT WITH OR WITHOUT PRESENCE OF ULCERS  *URINARY PROBLEMS  *BOWEL PROBLEMS  UNUSUAL RASH Items with * indicate a potential emergency and should be followed up as soon as possible.  Feel free to call the clinic you have any questions or concerns. The clinic phone number is [phone].

## 2014-05-12 ENCOUNTER — Other Ambulatory Visit: Payer: Self-pay | Admitting: *Deleted

## 2014-05-12 DIAGNOSIS — C9000 Multiple myeloma not having achieved remission: Secondary | ICD-10-CM

## 2014-05-12 MED ORDER — LENALIDOMIDE 25 MG PO CAPS: ORAL_CAPSULE | ORAL | Status: DC

## 2014-05-15 NOTE — Telephone Encounter (Signed)
RECEIVED A FAX FROM [facility] CONCERNING A CONFIRMATION OF FACSIMILE RECEIPT FOR PT. REFERRAL. 

## 2014-05-16 ENCOUNTER — Other Ambulatory Visit: Payer: Self-pay | Admitting: Hematology and Oncology

## 2014-05-16 DIAGNOSIS — C9000 Multiple myeloma not having achieved remission: Secondary | ICD-10-CM

## 2014-05-16 NOTE — Telephone Encounter (Signed)
Called [facility].  Revlimid has been set up for shipment today.  Ativan 0.5 mg was ordered by Selena Lesser NP 04-24-2014.  Request to Dr. Alvy Bimler for review.

## 2014-05-18 ENCOUNTER — Other Ambulatory Visit: Payer: Self-pay | Admitting: Hematology and Oncology

## 2014-05-18 DIAGNOSIS — M899 Disorder of bone, unspecified: Secondary | ICD-10-CM

## 2014-05-18 DIAGNOSIS — C9000 Multiple myeloma not having achieved remission: Secondary | ICD-10-CM

## 2014-05-18 MED ORDER — FENTANYL 25 MCG/HR TD PT72: 25.0000 ug | MEDICATED_PATCH | TRANSDERMAL | Status: DC

## 2014-05-22 ENCOUNTER — Ambulatory Visit (HOSPITAL_BASED_OUTPATIENT_CLINIC_OR_DEPARTMENT_OTHER): Payer: BC Managed Care – PPO | Admitting: Hematology and Oncology

## 2014-05-22 ENCOUNTER — Ambulatory Visit (HOSPITAL_BASED_OUTPATIENT_CLINIC_OR_DEPARTMENT_OTHER): Payer: BC Managed Care – PPO

## 2014-05-22 ENCOUNTER — Encounter: Payer: Self-pay | Admitting: Hematology and Oncology

## 2014-05-22 ENCOUNTER — Ambulatory Visit (HOSPITAL_BASED_OUTPATIENT_CLINIC_OR_DEPARTMENT_OTHER): Payer: BC Managed Care – PPO | Admitting: Lab

## 2014-05-22 ENCOUNTER — Telehealth: Payer: Self-pay | Admitting: Hematology and Oncology

## 2014-05-22 VITALS — BP 116/71 | HR 98 | Temp 98.4°F | Resp 18 | Ht 72.0 in | Wt 214.4 lb

## 2014-05-22 DIAGNOSIS — D701 Agranulocytosis secondary to cancer chemotherapy: Secondary | ICD-10-CM

## 2014-05-22 DIAGNOSIS — Z5112 Encounter for antineoplastic immunotherapy: Secondary | ICD-10-CM

## 2014-05-22 DIAGNOSIS — C7951 Secondary malignant neoplasm of bone: Secondary | ICD-10-CM

## 2014-05-22 DIAGNOSIS — D72819 Decreased white blood cell count, unspecified: Secondary | ICD-10-CM

## 2014-05-22 DIAGNOSIS — M899 Disorder of bone, unspecified: Secondary | ICD-10-CM

## 2014-05-22 DIAGNOSIS — C9000 Multiple myeloma not having achieved remission: Secondary | ICD-10-CM

## 2014-05-22 DIAGNOSIS — F419 Anxiety disorder, unspecified: Secondary | ICD-10-CM

## 2014-05-22 DIAGNOSIS — F413 Other mixed anxiety disorders: Secondary | ICD-10-CM

## 2014-05-22 DIAGNOSIS — T451X5A Adverse effect of antineoplastic and immunosuppressive drugs, initial encounter: Secondary | ICD-10-CM

## 2014-05-22 DIAGNOSIS — E119 Type 2 diabetes mellitus without complications: Secondary | ICD-10-CM

## 2014-05-22 LAB — CBC WITH DIFFERENTIAL/PLATELET
BASO%: 0.8 % (ref 0.0–2.0)
Basophils Absolute: 0 10*3/uL (ref 0.0–0.1)
EOS%: 0.1 % (ref 0.0–7.0)
Eosinophils Absolute: 0 10*3/uL (ref 0.0–0.5)
HEMATOCRIT: 40.4 % (ref 38.4–49.9)
HGB: 12.9 g/dL — ABNORMAL LOW (ref 13.0–17.1)
LYMPH%: 11 % — AB (ref 14.0–49.0)
MCH: 27.1 pg — AB (ref 27.2–33.4)
MCHC: 31.9 g/dL — AB (ref 32.0–36.0)
MCV: 84.9 fL (ref 79.3–98.0)
MONO#: 0.1 10*3/uL (ref 0.1–0.9)
MONO%: 1.6 % (ref 0.0–14.0)
NEUT#: 3.1 10*3/uL (ref 1.5–6.5)
NEUT%: 86.5 % — ABNORMAL HIGH (ref 39.0–75.0)
PLATELETS: 263 10*3/uL (ref 140–400)
RBC: 4.76 10*6/uL (ref 4.20–5.82)
RDW: 16 % — ABNORMAL HIGH (ref 11.0–14.6)
WBC: 3.6 10*3/uL — ABNORMAL LOW (ref 4.0–10.3)
lymph#: 0.4 10*3/uL — ABNORMAL LOW (ref 0.9–3.3)

## 2014-05-22 LAB — COMPREHENSIVE METABOLIC PANEL (CC13)
ALK PHOS: 112 U/L (ref 40–150)
ALT: 40 U/L (ref 0–55)
ANION GAP: 11 meq/L (ref 3–11)
AST: 38 U/L — AB (ref 5–34)
Albumin: 4 g/dL (ref 3.5–5.0)
BILIRUBIN TOTAL: 0.61 mg/dL (ref 0.20–1.20)
BUN: 10.7 mg/dL (ref 7.0–26.0)
CO2: 21 meq/L — AB (ref 22–29)
Calcium: 10 mg/dL (ref 8.4–10.4)
Chloride: 105 mEq/L (ref 98–109)
Creatinine: 0.8 mg/dL (ref 0.7–1.3)
EGFR: >90 mL/min/{1.73_m2} (ref >90)
Glucose: 208 mg/dl — ABNORMAL HIGH (ref 70–140)
Potassium: 4.1 mEq/L (ref 3.5–5.1)
Sodium: 137 mEq/L (ref 136–145)
Total Protein: 8 g/dL (ref 6.4–8.3)

## 2014-05-22 MED ORDER — DIAZEPAM 5 MG PO TABS: 5.0000 mg | ORAL_TABLET | Freq: Every day | ORAL | Status: AC

## 2014-05-22 MED ORDER — BORTEZOMIB CHEMO SQ INJECTION 3.5 MG (2.5MG/ML)
1.3000 mg/m2 | Freq: Once | INTRAMUSCULAR | Status: AC
  Administered 2014-05-22: 3 mg via SUBCUTANEOUS
  Filled 2014-05-22: qty 3

## 2014-05-22 MED ORDER — ONDANSETRON HCL 8 MG PO TABS
ORAL_TABLET | ORAL | Status: AC
  Filled 2014-05-22: qty 1

## 2014-05-22 MED ORDER — ONDANSETRON HCL 8 MG PO TABS
8.0000 mg | ORAL_TABLET | Freq: Once | ORAL | Status: AC
  Administered 2014-05-22: 8 mg via ORAL

## 2014-05-22 MED ORDER — FENTANYL 25 MCG/HR TD PT72: 25.0000 ug | MEDICATED_PATCH | TRANSDERMAL | Status: DC

## 2014-05-22 NOTE — Telephone Encounter (Signed)
Gave avs & cal for Jan. Sent mess to sch tx. °

## 2014-05-22 NOTE — Patient Instructions (Signed)
[facility] Discharge Instructions for Patients Receiving Chemotherapy  Today you received the following chemotherapy agents Velcade.  To help prevent nausea and vomiting after your treatment, we encourage you to take your nausea medication as directed.    If you develop nausea and vomiting that is not controlled by your nausea medication, call the clinic.   BELOW ARE SYMPTOMS THAT SHOULD BE REPORTED IMMEDIATELY:  *FEVER GREATER THAN 100.5 F  *CHILLS WITH OR WITHOUT FEVER  NAUSEA AND VOMITING THAT IS NOT CONTROLLED WITH YOUR NAUSEA MEDICATION  *UNUSUAL SHORTNESS OF BREATH  *UNUSUAL BRUISING OR BLEEDING  TENDERNESS IN MOUTH AND THROAT WITH OR WITHOUT PRESENCE OF ULCERS  *URINARY PROBLEMS  *BOWEL PROBLEMS  UNUSUAL RASH Items with * indicate a potential emergency and should be followed up as soon as possible.  Feel free to call the clinic you have any questions or concerns. The clinic phone number is [phone].    

## 2014-05-22 NOTE — Assessment & Plan Note (Signed)
He has chronic anxiety disorder and requests refill of his prescription Valium. He also had recent prescription refill of Ativan.
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
## 2014-05-22 NOTE — Assessment & Plan Note (Signed)
This is likely due to recent treatment. The patient denies recent history of fevers, cough, chills, diarrhea or dysuria. He is asymptomatic from the leukopenia. I will observe for now.  I will continue the chemotherapy at current dose without dosage adjustment.  If the leukopenia gets progressive worse in the future, I might have to delay his treatment or adjust the chemotherapy dose. 

## 2014-05-22 NOTE — Assessment & Plan Note (Signed)
His hyperglycemia was recently exacerbated by steroids. I recommend dietary modification.

## 2014-05-22 NOTE — Assessment & Plan Note (Signed)
He has severe bone pain from myeloma and chronic back pain from prior injury. I refilled his prescription fentanyl patch today. We discussed narcotic refill policy.

## 2014-05-22 NOTE — Assessment & Plan Note (Signed)
He tolerated treatment well apart from hyperglycemia. I will recheck his serum protein electrophoresis and free light chain again next month.

## 2014-05-23 ENCOUNTER — Telehealth: Payer: Self-pay | Admitting: *Deleted

## 2014-05-23 NOTE — Telephone Encounter (Signed)
Per staff message and POF I have scheduled appts. Advised scheduler of appts. JMW  

## 2014-05-25 ENCOUNTER — Other Ambulatory Visit (HOSPITAL_BASED_OUTPATIENT_CLINIC_OR_DEPARTMENT_OTHER): Payer: BC Managed Care – PPO

## 2014-05-25 ENCOUNTER — Ambulatory Visit: Payer: BC Managed Care – PPO

## 2014-05-25 DIAGNOSIS — C9000 Multiple myeloma not having achieved remission: Secondary | ICD-10-CM

## 2014-05-25 LAB — COMPREHENSIVE METABOLIC PANEL (CC13)
ALBUMIN: 3.4 g/dL — AB (ref 3.5–5.0)
ALT: 37 U/L (ref 0–55)
AST: 31 U/L (ref 5–34)
Alkaline Phosphatase: 86 U/L (ref 40–150)
Anion Gap: 10 mEq/L (ref 3–11)
BUN: 12 mg/dL (ref 7.0–26.0)
CO2: 23 meq/L (ref 22–29)
Calcium: 8.9 mg/dL (ref 8.4–10.4)
Chloride: 104 mEq/L (ref 98–109)
Creatinine: 0.7 mg/dL (ref 0.7–1.3)
Glucose: 133 mg/dl (ref 70–140)
POTASSIUM: 3.7 meq/L (ref 3.5–5.1)
SODIUM: 137 meq/L (ref 136–145)
TOTAL PROTEIN: 6.7 g/dL (ref 6.4–8.3)
Total Bilirubin: 0.36 mg/dL (ref 0.20–1.20)

## 2014-05-25 LAB — CBC WITH DIFFERENTIAL/PLATELET
BASO%: 2.9 % — AB (ref 0.0–2.0)
Basophils Absolute: 0.1 10*3/uL (ref 0.0–0.1)
EOS ABS: 0.1 10*3/uL (ref 0.0–0.5)
EOS%: 2.9 % (ref 0.0–7.0)
HCT: 36.2 % — ABNORMAL LOW (ref 38.4–49.9)
HGB: 12 g/dL — ABNORMAL LOW (ref 13.0–17.1)
LYMPH#: 1.7 10*3/uL (ref 0.9–3.3)
LYMPH%: 53.4 % — ABNORMAL HIGH (ref 14.0–49.0)
MCH: 27.8 pg (ref 27.2–33.4)
MCHC: 33.1 g/dL (ref 32.0–36.0)
MCV: 84 fL (ref 79.3–98.0)
MONO#: 0.4 10*3/uL (ref 0.1–0.9)
MONO%: 12.5 % (ref 0.0–14.0)
NEUT%: 28.3 % — ABNORMAL LOW (ref 39.0–75.0)
NEUTROS ABS: 0.9 10*3/uL — AB (ref 1.5–6.5)
PLATELETS: 165 10*3/uL (ref 140–400)
RBC: 4.31 10*6/uL (ref 4.20–5.82)
RDW: 14.3 % (ref 11.0–14.6)
WBC: 3.1 10*3/uL — ABNORMAL LOW (ref 4.0–10.3)

## 2014-05-25 MED ORDER — ONDANSETRON HCL 8 MG PO TABS
ORAL_TABLET | ORAL | Status: AC
  Filled 2014-05-25: qty 1

## 2014-05-25 NOTE — Progress Notes (Signed)
Per Dr. Benay Spice (on call for Dr. Alvy Bimler), hold treatment today due to neut count 0.9.  Spoke to patient and wife in lobby.  Copy of labs given.  Pt instructed to return Monday as scheduled, and to notify us of any fever.  Pt and wife verbalized understanding.

## 2014-05-29 ENCOUNTER — Other Ambulatory Visit: Payer: Self-pay | Admitting: Hematology and Oncology

## 2014-05-29 ENCOUNTER — Other Ambulatory Visit (HOSPITAL_BASED_OUTPATIENT_CLINIC_OR_DEPARTMENT_OTHER): Payer: BC Managed Care – PPO

## 2014-05-29 ENCOUNTER — Ambulatory Visit (HOSPITAL_BASED_OUTPATIENT_CLINIC_OR_DEPARTMENT_OTHER): Payer: BC Managed Care – PPO

## 2014-05-29 ENCOUNTER — Telehealth: Payer: Self-pay | Admitting: Hematology and Oncology

## 2014-05-29 ENCOUNTER — Encounter: Payer: Self-pay | Admitting: Hematology and Oncology

## 2014-05-29 ENCOUNTER — Telehealth: Payer: Self-pay | Admitting: *Deleted

## 2014-05-29 VITALS — BP 125/71 | HR 91 | Temp 97.7°F | Resp 20

## 2014-05-29 DIAGNOSIS — Z5112 Encounter for antineoplastic immunotherapy: Secondary | ICD-10-CM

## 2014-05-29 DIAGNOSIS — C9000 Multiple myeloma not having achieved remission: Secondary | ICD-10-CM

## 2014-05-29 LAB — CBC WITH DIFFERENTIAL/PLATELET
BASO%: 0.6 % (ref 0.0–2.0)
Basophils Absolute: 0 10*3/uL (ref 0.0–0.1)
EOS%: 0.1 % (ref 0.0–7.0)
Eosinophils Absolute: 0 10*3/uL (ref 0.0–0.5)
HCT: 42.4 % (ref 38.4–49.9)
HGB: 13.3 g/dL (ref 13.0–17.1)
LYMPH#: 0.4 10*3/uL — AB (ref 0.9–3.3)
LYMPH%: 8.3 % — AB (ref 14.0–49.0)
MCH: 27.1 pg — ABNORMAL LOW (ref 27.2–33.4)
MCHC: 31.4 g/dL — AB (ref 32.0–36.0)
MCV: 86.3 fL (ref 79.3–98.0)
MONO#: 0 10*3/uL — ABNORMAL LOW (ref 0.1–0.9)
MONO%: 1 % (ref 0.0–14.0)
NEUT#: 4.3 10*3/uL (ref 1.5–6.5)
NEUT%: 90 % — ABNORMAL HIGH (ref 39.0–75.0)
Platelets: 223 10*3/uL (ref 140–400)
RBC: 4.91 10*6/uL (ref 4.20–5.82)
RDW: 16.1 % — ABNORMAL HIGH (ref 11.0–14.6)
WBC: 4.7 10*3/uL (ref 4.0–10.3)

## 2014-05-29 LAB — COMPREHENSIVE METABOLIC PANEL (CC13)
ALK PHOS: 109 U/L (ref 40–150)
ALT: 33 U/L (ref 0–55)
AST: 27 U/L (ref 5–34)
Albumin: 3.9 g/dL (ref 3.5–5.0)
Anion Gap: 16 mEq/L — ABNORMAL HIGH (ref 3–11)
BILIRUBIN TOTAL: 0.58 mg/dL (ref 0.20–1.20)
BUN: 13.6 mg/dL (ref 7.0–26.0)
CHLORIDE: 103 meq/L (ref 98–109)
CO2: 15 mEq/L — ABNORMAL LOW (ref 22–29)
Calcium: 9.4 mg/dL (ref 8.4–10.4)
Creatinine: 1.1 mg/dL (ref 0.7–1.3)
EGFR: 85 mL/min/{1.73_m2} — AB (ref >90)
GLUCOSE: 391 mg/dL — AB (ref 70–140)
Potassium: 4.6 mEq/L (ref 3.5–5.1)
Sodium: 134 mEq/L — ABNORMAL LOW (ref 136–145)
Total Protein: 7.7 g/dL (ref 6.4–8.3)

## 2014-05-29 MED ORDER — ONDANSETRON HCL 8 MG PO TABS
ORAL_TABLET | ORAL | Status: AC
  Filled 2014-05-29: qty 1

## 2014-05-29 MED ORDER — ONDANSETRON HCL 8 MG PO TABS
8.0000 mg | ORAL_TABLET | Freq: Once | ORAL | Status: AC
Start: 2014-05-29 — End: 2014-05-29
  Administered 2014-05-29: 8 mg via ORAL

## 2014-05-29 MED ORDER — BORTEZOMIB CHEMO SQ INJECTION 3.5 MG (2.5MG/ML)
1.3000 mg/m2 | Freq: Once | INTRAMUSCULAR | Status: AC
  Administered 2014-05-29: 3 mg via SUBCUTANEOUS
  Filled 2014-05-29: qty 3

## 2014-05-29 MED ORDER — PROCHLORPERAZINE MALEATE 10 MG PO TABS: 10.0000 mg | ORAL_TABLET | Freq: Four times a day (QID) | ORAL | Status: DC | PRN

## 2014-05-29 NOTE — Telephone Encounter (Signed)
Spoke with patient- states he only needs compazine at this time

## 2014-05-29 NOTE — Progress Notes (Signed)
Informed pt of his elevated blood sugar on labs today.  Pt states he ate on his way over here today.  He also reports his PCP has prescribed him insulin based on sliding scale and he is checking his blood sugar twice daily.  Encouraged pt to continue to check twice daily and report to his PCP if they continue to be elevated above 200.  He verbalized understanding.

## 2014-05-29 NOTE — Telephone Encounter (Signed)
Confirm appt for 06/15/14. Mailed cal.

## 2014-05-29 NOTE — Patient Instructions (Signed)
[facility] Discharge Instructions for Patients Receiving Chemotherapy  Today you received the following chemotherapy agents Velcade.  To help prevent nausea and vomiting after your treatment, we encourage you to take your nausea medication as directed.    If you develop nausea and vomiting that is not controlled by your nausea medication, call the clinic.   BELOW ARE SYMPTOMS THAT SHOULD BE REPORTED IMMEDIATELY:  *FEVER GREATER THAN 100.5 F  *CHILLS WITH OR WITHOUT FEVER  NAUSEA AND VOMITING THAT IS NOT CONTROLLED WITH YOUR NAUSEA MEDICATION  *UNUSUAL SHORTNESS OF BREATH  *UNUSUAL BRUISING OR BLEEDING  TENDERNESS IN MOUTH AND THROAT WITH OR WITHOUT PRESENCE OF ULCERS  *URINARY PROBLEMS  *BOWEL PROBLEMS  UNUSUAL RASH Items with * indicate a potential emergency and should be followed up as soon as possible.  Feel free to call the clinic you have any questions or concerns. The clinic phone number is [phone].    

## 2014-05-31 ENCOUNTER — Encounter: Payer: Self-pay | Admitting: Hematology and Oncology

## 2014-05-31 ENCOUNTER — Other Ambulatory Visit: Payer: Self-pay | Admitting: *Deleted

## 2014-05-31 MED ORDER — GABAPENTIN 600 MG PO TABS: 600.0000 mg | ORAL_TABLET | Freq: Three times a day (TID) | ORAL | Status: DC

## 2014-05-31 NOTE — Telephone Encounter (Signed)
Confirmed with pharmacy that his last refill of Ativan 0.5 mg was 05/25/14 ([facility] in [place], [place]). Last fill of Neurontin there was in May 2015 for 300 mg capsules, but he sometimes goes to the [facility] in [place] for his meds. [facility] location of [place] : Last fill or Neurontin was 600 mg capsule tid #90 on 05/01/2014 by [name], NP with Dr. Maryjean Ka (NS). Collaborative nurse, Sharlynn Oliphant, RN reports in her conversation with Raciel this week he has plenty of Ativan at this time. OK to refill his Neurontin X 1.

## 2014-06-01 ENCOUNTER — Ambulatory Visit (HOSPITAL_BASED_OUTPATIENT_CLINIC_OR_DEPARTMENT_OTHER): Payer: BC Managed Care – PPO

## 2014-06-01 ENCOUNTER — Ambulatory Visit (HOSPITAL_BASED_OUTPATIENT_CLINIC_OR_DEPARTMENT_OTHER): Payer: BC Managed Care – PPO | Admitting: Nurse Practitioner

## 2014-06-01 ENCOUNTER — Other Ambulatory Visit (HOSPITAL_BASED_OUTPATIENT_CLINIC_OR_DEPARTMENT_OTHER): Payer: BC Managed Care – PPO

## 2014-06-01 ENCOUNTER — Other Ambulatory Visit: Payer: Self-pay | Admitting: Nurse Practitioner

## 2014-06-01 DIAGNOSIS — R110 Nausea: Secondary | ICD-10-CM

## 2014-06-01 DIAGNOSIS — Z5112 Encounter for antineoplastic immunotherapy: Secondary | ICD-10-CM

## 2014-06-01 DIAGNOSIS — C9000 Multiple myeloma not having achieved remission: Secondary | ICD-10-CM

## 2014-06-01 DIAGNOSIS — H109 Unspecified conjunctivitis: Secondary | ICD-10-CM

## 2014-06-01 DIAGNOSIS — G893 Neoplasm related pain (acute) (chronic): Secondary | ICD-10-CM

## 2014-06-01 DIAGNOSIS — C7951 Secondary malignant neoplasm of bone: Secondary | ICD-10-CM

## 2014-06-01 DIAGNOSIS — M899 Disorder of bone, unspecified: Secondary | ICD-10-CM

## 2014-06-01 LAB — COMPREHENSIVE METABOLIC PANEL (CC13)
ALK PHOS: 90 U/L (ref 40–150)
ALT: 43 U/L (ref 0–55)
AST: 42 U/L — ABNORMAL HIGH (ref 5–34)
Albumin: 3.5 g/dL (ref 3.5–5.0)
Anion Gap: 10 mEq/L (ref 3–11)
BUN: 11.6 mg/dL (ref 7.0–26.0)
CO2: 25 mEq/L (ref 22–29)
Calcium: 8.5 mg/dL (ref 8.4–10.4)
Chloride: 102 mEq/L (ref 98–109)
Creatinine: 0.8 mg/dL (ref 0.7–1.3)
GLUCOSE: 187 mg/dL — AB (ref 70–140)
POTASSIUM: 3.6 meq/L (ref 3.5–5.1)
Sodium: 136 mEq/L (ref 136–145)
Total Bilirubin: 0.46 mg/dL (ref 0.20–1.20)
Total Protein: 6.9 g/dL (ref 6.4–8.3)

## 2014-06-01 LAB — CBC WITH DIFFERENTIAL/PLATELET
BASO%: 1.9 % (ref 0.0–2.0)
Basophils Absolute: 0.1 10*3/uL (ref 0.0–0.1)
EOS%: 2 % (ref 0.0–7.0)
Eosinophils Absolute: 0.1 10*3/uL (ref 0.0–0.5)
HCT: 39.7 % (ref 38.4–49.9)
HGB: 12.6 g/dL — ABNORMAL LOW (ref 13.0–17.1)
LYMPH%: 32.9 % (ref 14.0–49.0)
MCH: 26.9 pg — AB (ref 27.2–33.4)
MCHC: 31.8 g/dL — ABNORMAL LOW (ref 32.0–36.0)
MCV: 84.7 fL (ref 79.3–98.0)
MONO#: 0.6 10*3/uL (ref 0.1–0.9)
MONO%: 12.1 % (ref 0.0–14.0)
NEUT#: 2.4 10*3/uL (ref 1.5–6.5)
NEUT%: 51.1 % (ref 39.0–75.0)
PLATELETS: 186 10*3/uL (ref 140–400)
RBC: 4.69 10*6/uL (ref 4.20–5.82)
RDW: 15.3 % — AB (ref 11.0–14.6)
WBC: 4.7 10*3/uL (ref 4.0–10.3)
lymph#: 1.5 10*3/uL (ref 0.9–3.3)

## 2014-06-01 MED ORDER — ERYTHROMYCIN 5 MG/GM OP OINT
1.0000 | TOPICAL_OINTMENT | Freq: Two times a day (BID) | OPHTHALMIC | Status: DC
Start: 2014-06-01 — End: 2014-06-15

## 2014-06-01 MED ORDER — LORAZEPAM 0.5 MG PO TABS: 0.5000 mg | ORAL_TABLET | Freq: Four times a day (QID) | ORAL | Status: DC | PRN

## 2014-06-01 MED ORDER — BORTEZOMIB CHEMO SQ INJECTION 3.5 MG (2.5MG/ML)
1.3000 mg/m2 | Freq: Once | INTRAMUSCULAR | Status: AC
  Administered 2014-06-01: 3 mg via SUBCUTANEOUS
  Filled 2014-06-01: qty 3

## 2014-06-01 MED ORDER — ONDANSETRON HCL 8 MG PO TABS
ORAL_TABLET | ORAL | Status: AC
  Filled 2014-06-01: qty 1

## 2014-06-01 MED ORDER — ONDANSETRON HCL 8 MG PO TABS
8.0000 mg | ORAL_TABLET | Freq: Once | ORAL | Status: AC
  Administered 2014-06-01: 8 mg via ORAL

## 2014-06-01 NOTE — Progress Notes (Signed)
1545-Pt.'s left eye swollen, itching and also "burns" at times.  Michel Harrow NP to infusion room to assess pt.

## 2014-06-01 NOTE — Patient Instructions (Signed)
[facility] Discharge Instructions for Patients Receiving Chemotherapy  Today you received the following chemotherapy agents:  Velcade  To help prevent nausea and vomiting after your treatment, we encourage you to take your nausea medication as ordered per MD.   If you develop nausea and vomiting that is not controlled by your nausea medication, call the clinic.   BELOW ARE SYMPTOMS THAT SHOULD BE REPORTED IMMEDIATELY:  *FEVER GREATER THAN 100.5 F  *CHILLS WITH OR WITHOUT FEVER  NAUSEA AND VOMITING THAT IS NOT CONTROLLED WITH YOUR NAUSEA MEDICATION  *UNUSUAL SHORTNESS OF BREATH  *UNUSUAL BRUISING OR BLEEDING  TENDERNESS IN MOUTH AND THROAT WITH OR WITHOUT PRESENCE OF ULCERS  *URINARY PROBLEMS  *BOWEL PROBLEMS  UNUSUAL RASH Items with * indicate a potential emergency and should be followed up as soon as possible.  Feel free to call the clinic you have any questions or concerns. The clinic phone number is [phone].    

## 2014-06-02 ENCOUNTER — Encounter: Payer: Self-pay | Admitting: Nurse Practitioner

## 2014-06-02 DIAGNOSIS — R110 Nausea: Secondary | ICD-10-CM | POA: Insufficient documentation

## 2014-06-02 NOTE — Assessment & Plan Note (Signed)
Patient with known bone metastasis and suffering with chronic pain. Patient states his pain is fairly well managed at this present time with both a signal patch and Dilaudid for breakthrough pain.
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
## 2014-06-02 NOTE — Assessment & Plan Note (Signed)
Pt has what appears to be either the initial phase of a stye or conjunctivitis to his left eye.  Minimal erythema and tenderness to left inner canthus noted on exam. No purulent drainage from eye. No visual complaints. Advised pt to use warm, moist compresses to eye; and gave pt prescription for erythromycin opthalmic ointment.  Advised pt to call/return or go directly to Ed for further eval if worsening symptoms over holiday weekend.

## 2014-06-02 NOTE — Assessment & Plan Note (Signed)
Currently undergoing RVD therapy.  Reviewed pt's blood counts; and pt is not neutropenic.  Pt to receive cycle 3, day 8 of his velcade injection today. For patient has plans to return on Monday, 06/05/2014 for cycle 3, day 11 of the same regimen. Patient was be due to receive his next Zometa infusion on 06/05/2014 as well.

## 2014-06-02 NOTE — Assessment & Plan Note (Signed)
Patient requested refill of his Ativan today. Patient states that he has been alternating both Compazine and Ativan to prevent nausea 24/7; despite having no nausea symptoms. Advised pt to only take the nausea meds at the onset of nausea in the future. Ativan refill given.

## 2014-06-05 ENCOUNTER — Encounter: Payer: Self-pay | Admitting: Hematology and Oncology

## 2014-06-05 ENCOUNTER — Ambulatory Visit (HOSPITAL_BASED_OUTPATIENT_CLINIC_OR_DEPARTMENT_OTHER): Payer: BC Managed Care – PPO

## 2014-06-05 DIAGNOSIS — C9000 Multiple myeloma not having achieved remission: Secondary | ICD-10-CM

## 2014-06-05 DIAGNOSIS — Z5112 Encounter for antineoplastic immunotherapy: Secondary | ICD-10-CM

## 2014-06-05 MED ORDER — ONDANSETRON HCL 8 MG PO TABS
ORAL_TABLET | ORAL | Status: AC
  Filled 2014-06-05: qty 1

## 2014-06-05 MED ORDER — ONDANSETRON HCL 8 MG PO TABS
8.0000 mg | ORAL_TABLET | Freq: Once | ORAL | Status: AC
  Administered 2014-06-05: 8 mg via ORAL

## 2014-06-05 MED ORDER — BORTEZOMIB CHEMO SQ INJECTION 3.5 MG (2.5MG/ML)
1.3000 mg/m2 | Freq: Once | INTRAMUSCULAR | Status: AC
  Administered 2014-06-05: 3 mg via SUBCUTANEOUS
  Filled 2014-06-05: qty 3

## 2014-06-05 NOTE — Patient Instructions (Signed)
[facility] Discharge Instructions for Patients Receiving Chemotherapy  Today you received the following chemotherapy agents velcade   To help prevent nausea and vomiting after your treatment, we encourage you to take your nausea medication as directed  If you develop nausea and vomiting that is not controlled by your nausea medication, call the clinic.   BELOW ARE SYMPTOMS THAT SHOULD BE REPORTED IMMEDIATELY:  *FEVER GREATER THAN 100.5 F  *CHILLS WITH OR WITHOUT FEVER  NAUSEA AND VOMITING THAT IS NOT CONTROLLED WITH YOUR NAUSEA MEDICATION  *UNUSUAL SHORTNESS OF BREATH  *UNUSUAL BRUISING OR BLEEDING  TENDERNESS IN MOUTH AND THROAT WITH OR WITHOUT PRESENCE OF ULCERS  *URINARY PROBLEMS  *BOWEL PROBLEMS  UNUSUAL RASH Items with * indicate a potential emergency and should be followed up as soon as possible.  Feel free to call the clinic you have any questions or concerns. The clinic phone number is [phone].  

## 2014-06-05 NOTE — Progress Notes (Signed)
Pt refused zometa infusion today, would prefer to get it next scheduled appointment on 1/14.

## 2014-06-06 ENCOUNTER — Other Ambulatory Visit: Payer: Self-pay | Admitting: *Deleted

## 2014-06-06 ENCOUNTER — Other Ambulatory Visit: Payer: Self-pay | Admitting: Hematology and Oncology

## 2014-06-06 DIAGNOSIS — C9000 Multiple myeloma not having achieved remission: Secondary | ICD-10-CM

## 2014-06-06 LAB — SPEP & IFE WITH QIG
ALPHA-1-GLOBULIN: 4.1 % (ref 2.9–4.9)
ALPHA-2-GLOBULIN: 9.5 % (ref 7.1–11.8)
Albumin ELP: 57.2 % (ref 55.8–66.1)
Beta 2: 3.8 % (ref 3.2–6.5)
Beta Globulin: 6.8 % (ref 4.7–7.2)
Gamma Globulin: 18.6 % (ref 11.1–18.8)
IGG (IMMUNOGLOBIN G), SERUM: 1420 mg/dL (ref 650–1600)
IgA: 171 mg/dL (ref 68–379)
IgM, Serum: 40 mg/dL — ABNORMAL LOW (ref 41–251)
M-Spike, %: 0.96 g/dL
TOTAL PROTEIN, SERUM ELECTROPHOR: 7 g/dL (ref 6.0–8.3)

## 2014-06-06 LAB — KAPPA/LAMBDA LIGHT CHAINS
KAPPA FREE LGHT CHN: 3.31 mg/dL — AB (ref 0.33–1.94)
KAPPA LAMBDA RATIO: 3.04 — AB (ref 0.26–1.65)
LAMBDA FREE LGHT CHN: 1.09 mg/dL (ref 0.57–2.63)

## 2014-06-06 NOTE — Telephone Encounter (Signed)
THIS REFILL REQUEST FOR REVLIMID WAS PLACED ON DR.GORSUCH'S DESK. 

## 2014-06-07 ENCOUNTER — Encounter: Payer: Self-pay | Admitting: Hematology and Oncology

## 2014-06-07 MED ORDER — LENALIDOMIDE 25 MG PO CAPS: ORAL_CAPSULE | ORAL | Status: DC

## 2014-06-07 NOTE — Telephone Encounter (Signed)
Revlimid refill completed. Received authorization number - [id number]. Per [employer], patient is due for a survey. Called and spoke with patient and let him know this. He is agreeable to do this.

## 2014-06-07 NOTE — Addendum Note (Signed)
Addended by: Christa See on: 06/07/2014 11:59 AM   Modules accepted: Orders

## 2014-06-12 ENCOUNTER — Ambulatory Visit: Payer: BC Managed Care – PPO | Admitting: Hematology and Oncology

## 2014-06-12 ENCOUNTER — Encounter: Payer: Self-pay | Admitting: Hematology and Oncology

## 2014-06-12 ENCOUNTER — Other Ambulatory Visit: Payer: BC Managed Care – PPO

## 2014-06-12 ENCOUNTER — Ambulatory Visit: Payer: BC Managed Care – PPO

## 2014-06-13 ENCOUNTER — Encounter: Payer: Self-pay | Admitting: Hematology and Oncology

## 2014-06-13 NOTE — Progress Notes (Signed)
Received approval letter from [employer]. Pt is approved for Revlimid for the 2016 calendar year.  The amount of the grant is $10,000.

## 2014-06-15 ENCOUNTER — Telehealth: Payer: Self-pay | Admitting: Hematology and Oncology

## 2014-06-15 ENCOUNTER — Ambulatory Visit (HOSPITAL_BASED_OUTPATIENT_CLINIC_OR_DEPARTMENT_OTHER): Payer: BC Managed Care – PPO | Admitting: Hematology and Oncology

## 2014-06-15 ENCOUNTER — Ambulatory Visit (HOSPITAL_BASED_OUTPATIENT_CLINIC_OR_DEPARTMENT_OTHER): Payer: BC Managed Care – PPO

## 2014-06-15 ENCOUNTER — Other Ambulatory Visit (HOSPITAL_BASED_OUTPATIENT_CLINIC_OR_DEPARTMENT_OTHER): Payer: BC Managed Care – PPO

## 2014-06-15 VITALS — BP 117/60 | HR 85 | Temp 97.7°F | Resp 18 | Ht 72.0 in | Wt 220.6 lb

## 2014-06-15 DIAGNOSIS — Z5112 Encounter for antineoplastic immunotherapy: Secondary | ICD-10-CM

## 2014-06-15 DIAGNOSIS — C9000 Multiple myeloma not having achieved remission: Secondary | ICD-10-CM

## 2014-06-15 DIAGNOSIS — E118 Type 2 diabetes mellitus with unspecified complications: Secondary | ICD-10-CM

## 2014-06-15 DIAGNOSIS — F418 Other specified anxiety disorders: Secondary | ICD-10-CM

## 2014-06-15 DIAGNOSIS — C7951 Secondary malignant neoplasm of bone: Secondary | ICD-10-CM

## 2014-06-15 LAB — COMPREHENSIVE METABOLIC PANEL (CC13)
ALK PHOS: 96 U/L (ref 40–150)
ALT: 33 U/L (ref 0–55)
AST: 42 U/L — ABNORMAL HIGH (ref 5–34)
Albumin: 3.6 g/dL (ref 3.5–5.0)
Anion Gap: 13 mEq/L — ABNORMAL HIGH (ref 3–11)
BUN: 10.1 mg/dL (ref 7.0–26.0)
CALCIUM: 8.8 mg/dL (ref 8.4–10.4)
CO2: 18 mEq/L — ABNORMAL LOW (ref 22–29)
CREATININE: 0.9 mg/dL (ref 0.7–1.3)
Chloride: 104 mEq/L (ref 98–109)
EGFR: >90 mL/min/{1.73_m2} (ref >90)
Glucose: 218 mg/dl — ABNORMAL HIGH (ref 70–140)
POTASSIUM: 3.8 meq/L (ref 3.5–5.1)
SODIUM: 136 meq/L (ref 136–145)
TOTAL PROTEIN: 7 g/dL (ref 6.4–8.3)
Total Bilirubin: 0.41 mg/dL (ref 0.20–1.20)

## 2014-06-15 LAB — CBC WITH DIFFERENTIAL/PLATELET
BASO%: 1.3 % (ref 0.0–2.0)
Basophils Absolute: 0.1 10*3/uL (ref 0.0–0.1)
EOS ABS: 0 10*3/uL (ref 0.0–0.5)
EOS%: 0.1 % (ref 0.0–7.0)
HEMATOCRIT: 38.4 % (ref 38.4–49.9)
HGB: 12.2 g/dL — ABNORMAL LOW (ref 13.0–17.1)
LYMPH#: 0.4 10*3/uL — AB (ref 0.9–3.3)
LYMPH%: 8.2 % — AB (ref 14.0–49.0)
MCH: 26.8 pg — AB (ref 27.2–33.4)
MCHC: 31.9 g/dL — AB (ref 32.0–36.0)
MCV: 84.1 fL (ref 79.3–98.0)
MONO#: 0.1 10*3/uL (ref 0.1–0.9)
MONO%: 3 % (ref 0.0–14.0)
NEUT%: 87.4 % — ABNORMAL HIGH (ref 39.0–75.0)
NEUTROS ABS: 3.9 10*3/uL (ref 1.5–6.5)
Platelets: 287 10*3/uL (ref 140–400)
RBC: 4.57 10*6/uL (ref 4.20–5.82)
RDW: 15.7 % — ABNORMAL HIGH (ref 11.0–14.6)
WBC: 4.5 10*3/uL (ref 4.0–10.3)

## 2014-06-15 MED ORDER — DEXAMETHASONE 4 MG PO TABS: ORAL_TABLET | ORAL | Status: DC

## 2014-06-15 MED ORDER — HYDROMORPHONE HCL 4 MG PO TABS: 4.0000 mg | ORAL_TABLET | Freq: Four times a day (QID) | ORAL | Status: DC | PRN

## 2014-06-15 MED ORDER — ONDANSETRON HCL 8 MG PO TABS
ORAL_TABLET | ORAL | Status: AC
  Filled 2014-06-15: qty 1

## 2014-06-15 MED ORDER — LORAZEPAM 0.5 MG PO TABS: 0.5000 mg | ORAL_TABLET | Freq: Four times a day (QID) | ORAL | Status: DC | PRN

## 2014-06-15 MED ORDER — BORTEZOMIB CHEMO SQ INJECTION 3.5 MG (2.5MG/ML)
1.3000 mg/m2 | Freq: Once | INTRAMUSCULAR | Status: AC
  Administered 2014-06-15: 3 mg via SUBCUTANEOUS
  Filled 2014-06-15: qty 3

## 2014-06-15 MED ORDER — ONDANSETRON HCL 8 MG PO TABS
8.0000 mg | ORAL_TABLET | Freq: Once | ORAL | Status: AC
  Administered 2014-06-15: 8 mg via ORAL

## 2014-06-15 NOTE — Patient Instructions (Signed)
Bortezomib injection What is this medicine? BORTEZOMIB (bor TEZ oh mib) is a chemotherapy drug. It slows the growth of cancer cells. This medicine is used to treat multiple myeloma, and certain lymphomas, such as mantle-cell lymphoma. This medicine may be used for other purposes; ask your health care provider or pharmacist if you have questions. COMMON BRAND NAME(S): Velcade What should I tell my health care provider before I take this medicine? They need to know if you have any of these conditions: -diabetes -heart disease -irregular heartbeat -liver disease -on hemodialysis -low blood counts, like low white blood cells, platelets, or hemoglobin -peripheral neuropathy -taking medicine for blood pressure -an unusual or allergic reaction to bortezomib, mannitol, boron, other medicines, foods, dyes, or preservatives -pregnant or trying to get pregnant -breast-feeding How should I use this medicine? This medicine is for injection into a vein or for injection under the skin. It is given by a health care professional in a hospital or clinic setting. Talk to your pediatrician regarding the use of this medicine in children. Special care may be needed. Overdosage: If you think you have taken too much of this medicine contact a poison control center or emergency room at once. NOTE: This medicine is only for you. Do not share this medicine with others. What if I miss a dose? It is important not to miss your dose. Call your doctor or health care professional if you are unable to keep an appointment. What may interact with this medicine? This medicine may interact with the following medications: -ketoconazole -rifampin -ritonavir -St. John's Wort This list may not describe all possible interactions. Give your health care provider a list of all the medicines, herbs, non-prescription drugs, or dietary supplements you use. Also tell them if you smoke, drink alcohol, or use illegal drugs. Some items  may interact with your medicine. What should I watch for while using this medicine? Visit your doctor for checks on your progress. This drug may make you feel generally unwell. This is not uncommon, as chemotherapy can affect healthy cells as well as cancer cells. Report any side effects. Continue your course of treatment even though you feel ill unless your doctor tells you to stop. You may get drowsy or dizzy. Do not drive, use machinery, or do anything that needs mental alertness until you know how this medicine affects you. Do not stand or sit up quickly, especially if you are an older patient. This reduces the risk of dizzy or fainting spells. In some cases, you may be given additional medicines to help with side effects. Follow all directions for their use. Call your doctor or health care professional for advice if you get a fever, chills or sore throat, or other symptoms of a cold or flu. Do not treat yourself. This drug decreases your body's ability to fight infections. Try to avoid being around people who are sick. This medicine may increase your risk to bruise or bleed. Call your doctor or health care professional if you notice any unusual bleeding. You may need blood work done while you are taking this medicine. In some patients, this medicine may cause a serious brain infection that may cause death. If you have any problems seeing, thinking, speaking, walking, or standing, tell your doctor right away. If you cannot reach your doctor, urgently seek other source of medical care. Do not become pregnant while taking this medicine. Women should inform their doctor if they wish to become pregnant or think they might be pregnant. There is   a potential for serious side effects to an unborn child. Talk to your health care professional or pharmacist for more information. Do not breast-feed an infant while taking this medicine. Check with your doctor or health care professional if you get an attack of  severe diarrhea, nausea and vomiting, or if you sweat a lot. The loss of too much body fluid can make it dangerous for you to take this medicine. What side effects may I notice from receiving this medicine? Side effects that you should report to your doctor or health care professional as soon as possible: -allergic reactions like skin rash, itching or hives, swelling of the face, lips, or tongue -breathing problems -changes in hearing -changes in vision -fast, irregular heartbeat -feeling faint or lightheaded, falls -pain, tingling, numbness in the hands or feet -right upper belly pain -seizures -swelling of the ankles, feet, hands -unusual bleeding or bruising -unusually weak or tired -vomiting -yellowing of the eyes or skin Side effects that usually do not require medical attention (report to your doctor or health care professional if they continue or are bothersome): -changes in emotions or moods -constipation -diarrhea -loss of appetite -headache -irritation at site where injected -nausea This list may not describe all possible side effects. Call your doctor for medical advice about side effects. You may report side effects to FDA at 1-800-FDA-1088. Where should I keep my medicine? This drug is given in a hospital or clinic and will not be stored at home. NOTE: This sheet is a summary. It may not cover all possible information. If you have questions about this medicine, talk to your doctor, pharmacist, or health care provider.  2015, [employer]. (2013-03-14 12:46:32)  

## 2014-06-15 NOTE — Telephone Encounter (Signed)
gv adn printed appt sched and avs for pt for Jan and Feb....sed added tx. °

## 2014-06-16 NOTE — Assessment & Plan Note (Signed)
His hyperglycemia was recently exacerbated by steroids. I recommend dietary modification. This has improved.
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
## 2014-06-16 NOTE — Assessment & Plan Note (Signed)
We had extensive discussion about the role of bone marrow transplant. The patient is thinking about deferring until the summer due to some ongoing projects at work. In the meantime, I recommend we proceed with cycle 4 of treatment. So far, he is tolerating treatment well without any side effects. I recommend dexamethasone taper over the next few weeks.

## 2014-06-16 NOTE — Assessment & Plan Note (Signed)
He has chronic anxiety disorder and requests refill of his prescription of Ativan.

## 2014-06-16 NOTE — Assessment & Plan Note (Signed)
He has severe bone pain from myeloma and chronic back pain from prior injury. We discussed narcotic refill policy.

## 2014-06-19 ENCOUNTER — Other Ambulatory Visit (HOSPITAL_BASED_OUTPATIENT_CLINIC_OR_DEPARTMENT_OTHER): Payer: BC Managed Care – PPO

## 2014-06-19 ENCOUNTER — Ambulatory Visit (HOSPITAL_BASED_OUTPATIENT_CLINIC_OR_DEPARTMENT_OTHER): Payer: BC Managed Care – PPO

## 2014-06-19 DIAGNOSIS — Z5112 Encounter for antineoplastic immunotherapy: Secondary | ICD-10-CM

## 2014-06-19 DIAGNOSIS — C9000 Multiple myeloma not having achieved remission: Secondary | ICD-10-CM

## 2014-06-19 DIAGNOSIS — M899 Disorder of bone, unspecified: Secondary | ICD-10-CM

## 2014-06-19 LAB — CBC WITH DIFFERENTIAL/PLATELET
BASO%: 1.4 % (ref 0.0–2.0)
BASOS ABS: 0.1 10*3/uL (ref 0.0–0.1)
EOS ABS: 0.1 10*3/uL (ref 0.0–0.5)
EOS%: 2.5 % (ref 0.0–7.0)
HCT: 38.2 % — ABNORMAL LOW (ref 38.4–49.9)
HEMOGLOBIN: 12.4 g/dL — AB (ref 13.0–17.1)
LYMPH%: 27.7 % (ref 14.0–49.0)
MCH: 27.3 pg (ref 27.2–33.4)
MCHC: 32.4 g/dL (ref 32.0–36.0)
MCV: 84 fL (ref 79.3–98.0)
MONO#: 0.5 10*3/uL (ref 0.1–0.9)
MONO%: 9.3 % (ref 0.0–14.0)
NEUT#: 2.9 10*3/uL (ref 1.5–6.5)
NEUT%: 59.1 % (ref 39.0–75.0)
PLATELETS: 265 10*3/uL (ref 140–400)
RBC: 4.54 10*6/uL (ref 4.20–5.82)
RDW: 16.3 % — ABNORMAL HIGH (ref 11.0–14.6)
WBC: 4.9 10*3/uL (ref 4.0–10.3)
lymph#: 1.3 10*3/uL (ref 0.9–3.3)

## 2014-06-19 LAB — COMPREHENSIVE METABOLIC PANEL (CC13)
ALBUMIN: 3.6 g/dL (ref 3.5–5.0)
ALT: 31 U/L (ref 0–55)
AST: 26 U/L (ref 5–34)
Alkaline Phosphatase: 90 U/L (ref 40–150)
Anion Gap: 10 mEq/L (ref 3–11)
BUN: 5.9 mg/dL — AB (ref 7.0–26.0)
CHLORIDE: 103 meq/L (ref 98–109)
CO2: 25 mEq/L (ref 22–29)
CREATININE: 0.7 mg/dL (ref 0.7–1.3)
Calcium: 8.9 mg/dL (ref 8.4–10.4)
GLUCOSE: 104 mg/dL (ref 70–140)
POTASSIUM: 3.6 meq/L (ref 3.5–5.1)
Sodium: 138 mEq/L (ref 136–145)
Total Bilirubin: 0.37 mg/dL (ref 0.20–1.20)
Total Protein: 6.7 g/dL (ref 6.4–8.3)

## 2014-06-19 MED ORDER — ONDANSETRON HCL 8 MG PO TABS
ORAL_TABLET | ORAL | Status: AC
  Filled 2014-06-19: qty 1

## 2014-06-19 MED ORDER — SODIUM CHLORIDE 0.9 % IV SOLN
Freq: Once | INTRAVENOUS | Status: AC
  Administered 2014-06-19: 14:00:00 via INTRAVENOUS

## 2014-06-19 MED ORDER — SODIUM CHLORIDE 0.9 % IJ SOLN
10.0000 mL | INTRAMUSCULAR | Status: DC | PRN
  Filled 2014-06-19: qty 10

## 2014-06-19 MED ORDER — ONDANSETRON HCL 8 MG PO TABS
8.0000 mg | ORAL_TABLET | Freq: Once | ORAL | Status: AC
  Administered 2014-06-19: 8 mg via ORAL

## 2014-06-19 MED ORDER — ZOLEDRONIC ACID 4 MG/100ML IV SOLN
4.0000 mg | Freq: Once | INTRAVENOUS | Status: AC
  Administered 2014-06-19: 4 mg via INTRAVENOUS
  Filled 2014-06-19: qty 100

## 2014-06-19 MED ORDER — BORTEZOMIB CHEMO SQ INJECTION 3.5 MG (2.5MG/ML)
1.3000 mg/m2 | Freq: Once | INTRAMUSCULAR | Status: AC
  Administered 2014-06-19: 3 mg via SUBCUTANEOUS
  Filled 2014-06-19: qty 3

## 2014-06-19 NOTE — Patient Instructions (Signed)
[facility] Discharge Instructions for Patients Receiving Chemotherapy  Today you received the following chemotherapy agents:  Velcade  To help prevent nausea and vomiting after your treatment, we encourage you to take your nausea medication as ordered per MD.   If you develop nausea and vomiting that is not controlled by your nausea medication, call the clinic.   BELOW ARE SYMPTOMS THAT SHOULD BE REPORTED IMMEDIATELY:  *FEVER GREATER THAN 100.5 F  *CHILLS WITH OR WITHOUT FEVER  NAUSEA AND VOMITING THAT IS NOT CONTROLLED WITH YOUR NAUSEA MEDICATION  *UNUSUAL SHORTNESS OF BREATH  *UNUSUAL BRUISING OR BLEEDING  TENDERNESS IN MOUTH AND THROAT WITH OR WITHOUT PRESENCE OF ULCERS  *URINARY PROBLEMS  *BOWEL PROBLEMS  UNUSUAL RASH Items with * indicate a potential emergency and should be followed up as soon as possible.  Feel free to call the clinic you have any questions or concerns. The clinic phone number is [phone].   Zoledronic Acid injection (Hypercalcemia, Oncology) What is this medicine? ZOLEDRONIC ACID (ZOE le dron ik AS id) lowers the amount of calcium loss from bone. It is used to treat too much calcium in your blood from cancer. It is also used to prevent complications of cancer that has spread to the bone. This medicine may be used for other purposes; ask your health care provider or pharmacist if you have questions. COMMON BRAND NAME(S): Zometa What should I tell my health care provider before I take this medicine? They need to know if you have any of these conditions: -aspirin-sensitive asthma -cancer, especially if you are receiving medicines used to treat cancer -dental disease or wear dentures -infection -kidney disease -receiving corticosteroids like dexamethasone or prednisone -an unusual or allergic reaction to zoledronic acid, other medicines, foods, dyes, or preservatives -pregnant or trying to get pregnant -breast-feeding How  should I use this medicine? This medicine is for infusion into a vein. It is given by a health care professional in a hospital or clinic setting. Talk to your pediatrician regarding the use of this medicine in children. Special care may be needed. Overdosage: If you think you have taken too much of this medicine contact a poison control center or emergency room at once. NOTE: This medicine is only for you. Do not share this medicine with others. What if I miss a dose? It is important not to miss your dose. Call your doctor or health care professional if you are unable to keep an appointment. What may interact with this medicine? -certain antibiotics given by injection -NSAIDs, medicines for pain and inflammation, like ibuprofen or naproxen -some diuretics like bumetanide, furosemide -teriparatide -thalidomide This list may not describe all possible interactions. Give your health care provider a list of all the medicines, herbs, non-prescription drugs, or dietary supplements you use. Also tell them if you smoke, drink alcohol, or use illegal drugs. Some items may interact with your medicine. What should I watch for while using this medicine? Visit your doctor or health care professional for regular checkups. It may be some time before you see the benefit from this medicine. Do not stop taking your medicine unless your doctor tells you to. Your doctor may order blood tests or other tests to see how you are doing. Women should inform their doctor if they wish to become pregnant or think they might be pregnant. There is a potential for serious side effects to an unborn child. Talk to your health care professional or pharmacist for more information. You should make sure  that you get enough calcium and vitamin D while you are taking this medicine. Discuss the foods you eat and the vitamins you take with your health care professional. Some people who take this medicine have severe bone, joint, and/or  muscle pain. This medicine may also increase your risk for jaw problems or a broken thigh bone. Tell your doctor right away if you have severe pain in your jaw, bones, joints, or muscles. Tell your doctor if you have any pain that does not go away or that gets worse. Tell your dentist and dental surgeon that you are taking this medicine. You should not have major dental surgery while on this medicine. See your dentist to have a dental exam and fix any dental problems before starting this medicine. Take good care of your teeth while on this medicine. Make sure you see your dentist for regular follow-up appointments. What side effects may I notice from receiving this medicine? Side effects that you should report to your doctor or health care professional as soon as possible: -allergic reactions like skin rash, itching or hives, swelling of the face, lips, or tongue -anxiety, confusion, or depression -breathing problems -changes in vision -eye pain -feeling faint or lightheaded, falls -jaw pain, especially after dental work -mouth sores -muscle cramps, stiffness, or weakness -trouble passing urine or change in the amount of urine Side effects that usually do not require medical attention (report to your doctor or health care professional if they continue or are bothersome): -bone, joint, or muscle pain -constipation -diarrhea -fever -hair loss -irritation at site where injected -loss of appetite -nausea, vomiting -stomach upset -trouble sleeping -trouble swallowing -weak or tired This list may not describe all possible side effects. Call your doctor for medical advice about side effects. You may report side effects to FDA at 1-800-FDA-1088. Where should I keep my medicine? This drug is given in a hospital or clinic and will not be stored at home. NOTE: This sheet is a summary. It may not cover all possible information. If you have questions about this medicine, talk to your doctor,  pharmacist, or health care provider.  2015, [employer]. (2012-10-28 13:03:13)

## 2014-06-21 ENCOUNTER — Telehealth: Payer: Self-pay | Admitting: Hematology and Oncology

## 2014-06-21 NOTE — Telephone Encounter (Signed)
returned call and sched tx to later time...ok and ware of new time

## 2014-06-22 ENCOUNTER — Ambulatory Visit: Payer: BC Managed Care – PPO

## 2014-06-22 ENCOUNTER — Ambulatory Visit (HOSPITAL_BASED_OUTPATIENT_CLINIC_OR_DEPARTMENT_OTHER): Payer: BC Managed Care – PPO

## 2014-06-22 DIAGNOSIS — C9000 Multiple myeloma not having achieved remission: Secondary | ICD-10-CM

## 2014-06-22 DIAGNOSIS — Z5112 Encounter for antineoplastic immunotherapy: Secondary | ICD-10-CM

## 2014-06-22 MED ORDER — BORTEZOMIB CHEMO SQ INJECTION 3.5 MG (2.5MG/ML)
1.3000 mg/m2 | Freq: Once | INTRAMUSCULAR | Status: AC
  Administered 2014-06-22: 3 mg via SUBCUTANEOUS
  Filled 2014-06-22: qty 3

## 2014-06-22 MED ORDER — ONDANSETRON HCL 8 MG PO TABS
8.0000 mg | ORAL_TABLET | Freq: Once | ORAL | Status: AC
  Administered 2014-06-22: 8 mg via ORAL

## 2014-06-22 MED ORDER — ONDANSETRON HCL 8 MG PO TABS
ORAL_TABLET | ORAL | Status: AC
  Filled 2014-06-22: qty 1

## 2014-06-22 NOTE — Patient Instructions (Signed)
[facility] Discharge Instructions for Patients Receiving Chemotherapy  Today you received the following chemotherapy agents Velcade   To help prevent nausea and vomiting after your treatment, we encourage you to take your nausea medication Compazine 10 mg every 6 hours as needed.   If you develop nausea and vomiting that is not controlled by your nausea medication, call the clinic.   BELOW ARE SYMPTOMS THAT SHOULD BE REPORTED IMMEDIATELY:  *FEVER GREATER THAN 100.5 F  *CHILLS WITH OR WITHOUT FEVER  NAUSEA AND VOMITING THAT IS NOT CONTROLLED WITH YOUR NAUSEA MEDICATION  *UNUSUAL SHORTNESS OF BREATH  *UNUSUAL BRUISING OR BLEEDING  TENDERNESS IN MOUTH AND THROAT WITH OR WITHOUT PRESENCE OF ULCERS  *URINARY PROBLEMS  *BOWEL PROBLEMS  UNUSUAL RASH Items with * indicate a potential emergency and should be followed up as soon as possible.  Feel free to call the clinic you have any questions or concerns. The clinic phone number is [phone].    

## 2014-06-26 ENCOUNTER — Ambulatory Visit (HOSPITAL_BASED_OUTPATIENT_CLINIC_OR_DEPARTMENT_OTHER): Payer: BC Managed Care – PPO

## 2014-06-26 ENCOUNTER — Other Ambulatory Visit (HOSPITAL_BASED_OUTPATIENT_CLINIC_OR_DEPARTMENT_OTHER): Payer: BC Managed Care – PPO

## 2014-06-26 ENCOUNTER — Other Ambulatory Visit: Payer: Self-pay

## 2014-06-26 VITALS — BP 119/72 | HR 75 | Temp 98.3°F | Resp 18

## 2014-06-26 DIAGNOSIS — C9000 Multiple myeloma not having achieved remission: Secondary | ICD-10-CM

## 2014-06-26 DIAGNOSIS — Z5112 Encounter for antineoplastic immunotherapy: Secondary | ICD-10-CM

## 2014-06-26 LAB — COMPREHENSIVE METABOLIC PANEL (CC13)
ALT: 37 U/L (ref 0–55)
AST: 40 U/L — ABNORMAL HIGH (ref 5–34)
Albumin: 3.6 g/dL (ref 3.5–5.0)
Alkaline Phosphatase: 89 U/L (ref 40–150)
Anion Gap: 13 mEq/L — ABNORMAL HIGH (ref 3–11)
BUN: 9.4 mg/dL (ref 7.0–26.0)
CO2: 23 mEq/L (ref 22–29)
Calcium: 9.3 mg/dL (ref 8.4–10.4)
Chloride: 100 mEq/L (ref 98–109)
Creatinine: 0.7 mg/dL (ref 0.7–1.3)
EGFR: >90 mL/min/{1.73_m2} (ref >90)
Glucose: 154 mg/dl — ABNORMAL HIGH (ref 70–140)
Potassium: 3.7 mEq/L (ref 3.5–5.1)
SODIUM: 136 meq/L (ref 136–145)
Total Bilirubin: 0.45 mg/dL (ref 0.20–1.20)
Total Protein: 7.1 g/dL (ref 6.4–8.3)

## 2014-06-26 LAB — CBC WITH DIFFERENTIAL/PLATELET
BASO%: 1.4 % (ref 0.0–2.0)
BASOS ABS: 0.1 10*3/uL (ref 0.0–0.1)
EOS%: 3.1 % (ref 0.0–7.0)
Eosinophils Absolute: 0.2 10*3/uL (ref 0.0–0.5)
HCT: 41 % (ref 38.4–49.9)
HEMOGLOBIN: 13.6 g/dL (ref 13.0–17.1)
LYMPH#: 1.5 10*3/uL (ref 0.9–3.3)
LYMPH%: 29.5 % (ref 14.0–49.0)
MCH: 27.4 pg (ref 27.2–33.4)
MCHC: 33.2 g/dL (ref 32.0–36.0)
MCV: 82.7 fL (ref 79.3–98.0)
MONO#: 0.9 10*3/uL (ref 0.1–0.9)
MONO%: 17.2 % — ABNORMAL HIGH (ref 0.0–14.0)
NEUT%: 48.8 % (ref 39.0–75.0)
NEUTROS ABS: 2.5 10*3/uL (ref 1.5–6.5)
Platelets: 211 10*3/uL (ref 140–400)
RBC: 4.96 10*6/uL (ref 4.20–5.82)
RDW: 14.6 % (ref 11.0–14.6)
WBC: 5.1 10*3/uL (ref 4.0–10.3)

## 2014-06-26 MED ORDER — BORTEZOMIB CHEMO SQ INJECTION 3.5 MG (2.5MG/ML)
1.3000 mg/m2 | Freq: Once | INTRAMUSCULAR | Status: AC
  Administered 2014-06-26: 3 mg via SUBCUTANEOUS
  Filled 2014-06-26: qty 3

## 2014-06-26 MED ORDER — ONDANSETRON HCL 8 MG PO TABS
ORAL_TABLET | ORAL | Status: AC
  Filled 2014-06-26: qty 1

## 2014-06-26 MED ORDER — ONDANSETRON HCL 8 MG PO TABS
8.0000 mg | ORAL_TABLET | Freq: Once | ORAL | Status: AC
  Administered 2014-06-26: 8 mg via ORAL

## 2014-06-26 MED ORDER — LENALIDOMIDE 25 MG PO CAPS: ORAL_CAPSULE | ORAL | Status: DC

## 2014-06-26 NOTE — Patient Instructions (Signed)
Bortezomib injection What is this medicine? BORTEZOMIB (bor TEZ oh mib) is a chemotherapy drug. It slows the growth of cancer cells. This medicine is used to treat multiple myeloma, and certain lymphomas, such as mantle-cell lymphoma. This medicine may be used for other purposes; ask your health care provider or pharmacist if you have questions. COMMON BRAND NAME(S): Velcade What should I tell my health care provider before I take this medicine? They need to know if you have any of these conditions: -diabetes -heart disease -irregular heartbeat -liver disease -on hemodialysis -low blood counts, like low white blood cells, platelets, or hemoglobin -peripheral neuropathy -taking medicine for blood pressure -an unusual or allergic reaction to bortezomib, mannitol, boron, other medicines, foods, dyes, or preservatives -pregnant or trying to get pregnant -breast-feeding How should I use this medicine? This medicine is for injection into a vein or for injection under the skin. It is given by a health care professional in a hospital or clinic setting. Talk to your pediatrician regarding the use of this medicine in children. Special care may be needed. Overdosage: If you think you have taken too much of this medicine contact a poison control center or emergency room at once. NOTE: This medicine is only for you. Do not share this medicine with others. What if I miss a dose? It is important not to miss your dose. Call your doctor or health care professional if you are unable to keep an appointment. What may interact with this medicine? This medicine may interact with the following medications: -ketoconazole -rifampin -ritonavir -St. John's Wort This list may not describe all possible interactions. Give your health care provider a list of all the medicines, herbs, non-prescription drugs, or dietary supplements you use. Also tell them if you smoke, drink alcohol, or use illegal drugs. Some items  may interact with your medicine. What should I watch for while using this medicine? Visit your doctor for checks on your progress. This drug may make you feel generally unwell. This is not uncommon, as chemotherapy can affect healthy cells as well as cancer cells. Report any side effects. Continue your course of treatment even though you feel ill unless your doctor tells you to stop. You may get drowsy or dizzy. Do not drive, use machinery, or do anything that needs mental alertness until you know how this medicine affects you. Do not stand or sit up quickly, especially if you are an older patient. This reduces the risk of dizzy or fainting spells. In some cases, you may be given additional medicines to help with side effects. Follow all directions for their use. Call your doctor or health care professional for advice if you get a fever, chills or sore throat, or other symptoms of a cold or flu. Do not treat yourself. This drug decreases your body's ability to fight infections. Try to avoid being around people who are sick. This medicine may increase your risk to bruise or bleed. Call your doctor or health care professional if you notice any unusual bleeding. You may need blood work done while you are taking this medicine. In some patients, this medicine may cause a serious brain infection that may cause death. If you have any problems seeing, thinking, speaking, walking, or standing, tell your doctor right away. If you cannot reach your doctor, urgently seek other source of medical care. Do not become pregnant while taking this medicine. Women should inform their doctor if they wish to become pregnant or think they might be pregnant. There is   a potential for serious side effects to an unborn child. Talk to your health care professional or pharmacist for more information. Do not breast-feed an infant while taking this medicine. Check with your doctor or health care professional if you get an attack of  severe diarrhea, nausea and vomiting, or if you sweat a lot. The loss of too much body fluid can make it dangerous for you to take this medicine. What side effects may I notice from receiving this medicine? Side effects that you should report to your doctor or health care professional as soon as possible: -allergic reactions like skin rash, itching or hives, swelling of the face, lips, or tongue -breathing problems -changes in hearing -changes in vision -fast, irregular heartbeat -feeling faint or lightheaded, falls -pain, tingling, numbness in the hands or feet -right upper belly pain -seizures -swelling of the ankles, feet, hands -unusual bleeding or bruising -unusually weak or tired -vomiting -yellowing of the eyes or skin Side effects that usually do not require medical attention (report to your doctor or health care professional if they continue or are bothersome): -changes in emotions or moods -constipation -diarrhea -loss of appetite -headache -irritation at site where injected -nausea This list may not describe all possible side effects. Call your doctor for medical advice about side effects. You may report side effects to FDA at 1-800-FDA-1088. Where should I keep my medicine? This drug is given in a hospital or clinic and will not be stored at home. NOTE: This sheet is a summary. It may not cover all possible information. If you have questions about this medicine, talk to your doctor, pharmacist, or health care provider.  2015, [employer]. (2013-03-14 12:46:32)  

## 2014-06-28 ENCOUNTER — Other Ambulatory Visit: Payer: Self-pay | Admitting: Hematology and Oncology

## 2014-06-28 ENCOUNTER — Other Ambulatory Visit: Payer: Self-pay | Admitting: *Deleted

## 2014-06-28 DIAGNOSIS — C9000 Multiple myeloma not having achieved remission: Secondary | ICD-10-CM

## 2014-06-28 LAB — KAPPA/LAMBDA LIGHT CHAINS
Kappa free light chain: 3.33 mg/dL — ABNORMAL HIGH (ref 0.33–1.94)
Kappa:Lambda Ratio: 2.36 — ABNORMAL HIGH (ref 0.26–1.65)
LAMBDA FREE LGHT CHN: 1.41 mg/dL (ref 0.57–2.63)

## 2014-06-28 LAB — BETA 2 MICROGLOBULIN, SERUM: Beta-2 Microglobulin: 2.84 mg/L — ABNORMAL HIGH (ref <=2.51)

## 2014-06-28 LAB — SPEP & IFE WITH QIG
Albumin ELP: 56.9 % (ref 55.8–66.1)
Alpha-1-Globulin: 4.8 % (ref 2.9–4.9)
Alpha-2-Globulin: 11.1 % (ref 7.1–11.8)
BETA 2: 5.9 % (ref 3.2–6.5)
BETA GLOBULIN: 6.4 % (ref 4.7–7.2)
Gamma Globulin: 14.9 % (ref 11.1–18.8)
IgA: 191 mg/dL (ref 68–379)
IgG (Immunoglobin G), Serum: 1080 mg/dL (ref 650–1600)
IgM, Serum: 47 mg/dL (ref 41–251)
M-Spike, %: 0.55 g/dL
Total Protein, Serum Electrophoresis: 7 g/dL (ref 6.0–8.3)

## 2014-06-28 MED ORDER — LENALIDOMIDE 25 MG PO CAPS: ORAL_CAPSULE | ORAL | Status: DC

## 2014-06-29 ENCOUNTER — Other Ambulatory Visit: Payer: Self-pay | Admitting: *Deleted

## 2014-06-29 MED ORDER — PROCHLORPERAZINE MALEATE 10 MG PO TABS: 10.0000 mg | ORAL_TABLET | Freq: Four times a day (QID) | ORAL | Status: DC | PRN

## 2014-06-29 MED ORDER — GABAPENTIN 600 MG PO TABS: 600.0000 mg | ORAL_TABLET | Freq: Three times a day (TID) | ORAL | Status: DC

## 2014-07-07 ENCOUNTER — Encounter: Payer: Self-pay | Admitting: Hematology and Oncology

## 2014-07-07 ENCOUNTER — Ambulatory Visit (HOSPITAL_BASED_OUTPATIENT_CLINIC_OR_DEPARTMENT_OTHER): Payer: BC Managed Care – PPO | Admitting: Hematology and Oncology

## 2014-07-07 ENCOUNTER — Telehealth: Payer: Self-pay | Admitting: Hematology and Oncology

## 2014-07-07 VITALS — BP 130/73 | HR 79 | Temp 98.2°F | Resp 18 | Ht 72.0 in | Wt 213.7 lb

## 2014-07-07 DIAGNOSIS — E119 Type 2 diabetes mellitus without complications: Secondary | ICD-10-CM

## 2014-07-07 DIAGNOSIS — M899 Disorder of bone, unspecified: Secondary | ICD-10-CM

## 2014-07-07 DIAGNOSIS — M545 Low back pain: Secondary | ICD-10-CM

## 2014-07-07 DIAGNOSIS — C9000 Multiple myeloma not having achieved remission: Secondary | ICD-10-CM

## 2014-07-07 DIAGNOSIS — C7951 Secondary malignant neoplasm of bone: Secondary | ICD-10-CM

## 2014-07-07 MED ORDER — FENTANYL 25 MCG/HR TD PT72: 25.0000 ug | MEDICATED_PATCH | TRANSDERMAL | Status: DC

## 2014-07-07 MED ORDER — LENALIDOMIDE 25 MG PO CAPS: ORAL_CAPSULE | ORAL | Status: DC

## 2014-07-07 NOTE — Telephone Encounter (Signed)
Pt confirmed labs/ov per 02/05 POF, gave pt AVS... KJ  °

## 2014-07-07 NOTE — Progress Notes (Signed)
[place] OFFICE PROGRESS NOTE  Patient Care Team: Jeanelle Malling, PA-C as PCP - General  SUMMARY OF ONCOLOGIC HISTORY:   Multiple myeloma   03/16/2014 Imaging Skeletal survey showed Multiple osseous lesions as as above supporting diagnosis of multiple myeloma   03/22/2014 Bone Marrow Biopsy Accession: [id number] bone marrow biopsy, from multiple myeloma.   04/10/2014 - 06/26/2014 Chemotherapy He received induction chemotherapy with Revlimid, Velcade and dexamethasone.   07/07/2014 -  Chemotherapy He received maintenance treatment with Revlimid alone    INTERVAL HISTORY: Please see below for problem oriented charting. He is seen today for further follow-up. He has made an informed decision not to pursue bone marrow transplant now but would like to proceed with stem cell collection. His diabetes appears to be under control. He continues to have chronic back pain from prior injury.  REVIEW OF SYSTEMS:   Constitutional: Denies fevers, chills or abnormal weight loss Eyes: Denies blurriness of vision Ears, nose, mouth, throat, and face: Denies mucositis or sore throat Respiratory: Denies cough, dyspnea or wheezes Cardiovascular: Denies palpitation, chest discomfort or lower extremity swelling Gastrointestinal:  Denies nausea, heartburn or change in bowel habits Skin: Denies abnormal skin rashes Lymphatics: Denies new lymphadenopathy or easy bruising Neurological:Denies numbness, tingling or new weaknesses Behavioral/Psych: Mood is stable, no new changes  All other systems were reviewed with the patient and are negative.  I have reviewed the past medical history, past surgical history, social history and family history with the patient and they are unchanged from previous note.  ALLERGIES:  is allergic to aleve and zofran.  MEDICATIONS:  Current Outpatient Prescriptions  Medication Sig Dispense Refill  . acyclovir (ZOVIRAX) 400 MG tablet Take 1 tablet (400 mg  total) by mouth 2 (two) times daily. 60 tablet 2  . aspirin 81 MG tablet Take 81 mg by mouth daily.    . diazepam (VALIUM) 5 MG tablet Take 1 tablet (5 mg total) by mouth at bedtime. 30 tablet 1  . fentaNYL (DURAGESIC - DOSED MCG/HR) 25 MCG/HR patch Place 1 patch (25 mcg total) onto the skin every 3 (three) days. 10 patch 0  . gabapentin (NEURONTIN) 600 MG tablet Take 1 tablet (600 mg total) by mouth 3 (three) times daily. 90 tablet 0  . HYDROmorphone (DILAUDID) 4 MG tablet Take 1 tablet (4 mg total) by mouth every 6 (six) hours as needed for severe pain. 90 tablet 0  . insulin aspart protamine - aspart (NOVOLOG 70/30 MIX) (70-30) 100 UNIT/ML FlexPen Inject 15units before breakfast;  10 units before supper for DM Type 2    . lenalidomide (REVLIMID) 25 MG capsule Take 1 capsule daily on days 1-21. Rest 7 and repeat every 28 days. 21 capsule 0  . LORazepam (ATIVAN) 0.5 MG tablet Take 1 tablet (0.5 mg total) by mouth every 6 (six) hours as needed for anxiety. 60 tablet 0  . metFORMIN (GLUCOPHAGE) 500 MG tablet Take 1,000 mg by mouth 2 (two) times daily with a meal.    . prochlorperazine (COMPAZINE) 10 MG tablet Take 1 tablet (10 mg total) by mouth every 6 (six) hours as needed (Nausea or vomiting). 30 tablet 1  . ranitidine (ZANTAC) 150 MG tablet Take 300 mg by mouth daily as needed for heartburn.      No current facility-administered medications for this visit.    PHYSICAL EXAMINATION: ECOG PERFORMANCE STATUS: 0 - Asymptomatic  Filed Vitals:   07/07/14 1315  BP: 130/73  Pulse: 79  Temp: 98.2 F (  36.8 C)  Resp: 18   Filed Weights   07/07/14 1315  Weight: 213 lb 11.2 oz (96.934 kg)    GENERAL:alert, no distress and comfortable SKIN: skin color, texture, turgor are normal, no rashes or significant lesions EYES: normal, Conjunctiva are pink and non-injected, sclera clear OROPHARYNX:no exudate, no erythema and lips, buccal mucosa, and tongue normal  NECK: supple, thyroid normal size,  non-tender, without nodularity LYMPH:  no palpable lymphadenopathy in the cervical, axillary or inguinal LUNGS: clear to auscultation and percussion with normal breathing effort HEART: regular rate & rhythm and no murmurs and no lower extremity edema ABDOMEN:abdomen soft, non-tender and normal bowel sounds Musculoskeletal:no cyanosis of digits and no clubbing  NEURO: alert & oriented x 3 with fluent speech, no focal motor/sensory deficits  LABORATORY DATA:  I have reviewed the data as listed    Component Value Date/Time   NA 136 06/26/2014 1419   NA 137 04/20/2012 1620   K 3.7 06/26/2014 1419   K 4.3 04/20/2012 1620   CL 101 04/20/2012 1620   CO2 23 06/26/2014 1419   CO2 27 04/20/2012 1620   GLUCOSE 154* 06/26/2014 1419   GLUCOSE 101* 04/20/2012 1620   BUN 9.4 06/26/2014 1419   BUN 14 04/20/2012 1620   CREATININE 0.7 06/26/2014 1419   CREATININE 0.84 04/20/2012 1620   CALCIUM 9.3 06/26/2014 1419   CALCIUM 9.8 04/20/2012 1620   PROT 7.1 06/26/2014 1419   ALBUMIN 3.6 06/26/2014 1419   AST 40* 06/26/2014 1419   ALT 37 06/26/2014 1419   ALKPHOS 89 06/26/2014 1419   BILITOT 0.45 06/26/2014 1419   GFRNONAA >90 04/20/2012 1620   GFRAA >90 04/20/2012 1620    No results found for: SPEP, UPEP  Lab Results  Component Value Date   WBC 5.1 06/26/2014   NEUTROABS 2.5 06/26/2014   HGB 13.6 06/26/2014   HCT 41.0 06/26/2014   MCV 82.7 06/26/2014   PLT 211 06/26/2014      Chemistry      Component Value Date/Time   NA 136 06/26/2014 1419   NA 137 04/20/2012 1620   K 3.7 06/26/2014 1419   K 4.3 04/20/2012 1620   CL 101 04/20/2012 1620   CO2 23 06/26/2014 1419   CO2 27 04/20/2012 1620   BUN 9.4 06/26/2014 1419   BUN 14 04/20/2012 1620   CREATININE 0.7 06/26/2014 1419   CREATININE 0.84 04/20/2012 1620      Component Value Date/Time   CALCIUM 9.3 06/26/2014 1419   CALCIUM 9.8 04/20/2012 1620   ALKPHOS 89 06/26/2014 1419   AST 40* 06/26/2014 1419   ALT 37 06/26/2014 1419    BILITOT 0.45 06/26/2014 1419     ASSESSMENT & PLAN:  Multiple myeloma We had extensive discussion about the role of bone marrow transplant. The patient is thinking about deferring until the summer due to some ongoing projects at work. He would like to proceed with stem cell collection. I defer to the patient to call [facility] for this. I recommend we discontinue dexamethasone and Velcade and to start on maintenance therapy with Revlimid only. He will continue on his current Revlimid prescription today for 14 days and then rest one week. Hopefully, he can proceed with stem cell collection at the end of the month or early March. I plan to see him back in the middle of March for further follow-up. In the future, he will go on Revlimid only, 25 mg 21 days on 7 days off,  along with Zometa every 3 months, next infusion will be due in April. He will continue aspirin therapy to prevent risk of DVT. He will also continue acyclovir for another 6 months for prophylaxis.    Bone metastases He has severe bone pain from myeloma and chronic back pain from prior injury. I refill his prescription fentanyl patch We discussed narcotic refill policy. He will continue Zometa every 3 months and will remain on calcium with vitamin D supplement.    DM2 (diabetes mellitus, type 2) His hyperglycemia was recently exacerbated by steroids. I recommend dietary modification. This has improved.     Orders Placed This Encounter  Procedures  . CBC with Differential/Platelet    Standing Status: Future     Number of Occurrences:      Standing Expiration Date: 08/11/2015  . SPEP & IFE with QIG    Standing Status: Future     Number of Occurrences:      Standing Expiration Date: 08/11/2015  . Kappa/lambda light chains    Standing Status: Future     Number of Occurrences:      Standing Expiration Date: 08/11/2015  . Beta 2 microglobulin, serum    Standing Status: Future     Number of  Occurrences:      Standing Expiration Date: 08/11/2015   All questions were answered. The patient knows to call the clinic with any problems, questions or concerns. No barriers to learning was detected. I spent 25 minutes counseling the patient face to face. The total time spent in the appointment was 30 minutes and more than 50% was on counseling and review of test results     [facility], Tove Wideman, MD 07/07/2014 2:10 PM

## 2014-07-07 NOTE — Assessment & Plan Note (Addendum)
We had extensive discussion about the role of bone marrow transplant. The patient is thinking about deferring until the summer due to some ongoing projects at work. He would like to proceed with stem cell collection. I defer to the patient to call [facility] for this. I recommend we discontinue dexamethasone and Velcade and to start on maintenance therapy with Revlimid only. He will continue on his current Revlimid prescription today for 14 days and then rest one week. Hopefully, he can proceed with stem cell collection at the end of the month or early March. I plan to see him back in the middle of March for further follow-up. In the future, he will go on Revlimid only, 25 mg 21 days on 7 days off, along with Zometa every 3 months, next infusion will be due in April. He will continue aspirin therapy to prevent risk of DVT. He will also continue acyclovir for another 6 months for prophylaxis.

## 2014-07-07 NOTE — Assessment & Plan Note (Signed)
His hyperglycemia was recently exacerbated by steroids. I recommend dietary modification. This has improved.

## 2014-07-07 NOTE — Assessment & Plan Note (Signed)
He has severe bone pain from myeloma and chronic back pain from prior injury. I refill his prescription fentanyl patch We discussed narcotic refill policy. He will continue Zometa every 3 months and will remain on calcium with vitamin D supplement.

## 2014-07-09 ENCOUNTER — Encounter (HOSPITAL_COMMUNITY): Payer: Self-pay | Admitting: *Deleted

## 2014-07-09 ENCOUNTER — Emergency Department (HOSPITAL_COMMUNITY): Payer: BC Managed Care – PPO

## 2014-07-09 ENCOUNTER — Emergency Department (HOSPITAL_COMMUNITY)
Admission: EM | Admit: 2014-07-09 | Discharge: 2014-07-09 | Disposition: A | Discharge status: Home / Self Care | Payer: BC Managed Care – PPO | Attending: Emergency Medicine | Admitting: Emergency Medicine

## 2014-07-09 DIAGNOSIS — M25552 Pain in left hip: Secondary | ICD-10-CM | POA: Insufficient documentation

## 2014-07-09 DIAGNOSIS — Z87442 Personal history of urinary calculi: Secondary | ICD-10-CM | POA: Insufficient documentation

## 2014-07-09 DIAGNOSIS — Z8719 Personal history of other diseases of the digestive system: Secondary | ICD-10-CM | POA: Diagnosis not present

## 2014-07-09 DIAGNOSIS — C7951 Secondary malignant neoplasm of bone: Secondary | ICD-10-CM | POA: Diagnosis not present

## 2014-07-09 DIAGNOSIS — Z87891 Personal history of nicotine dependence: Secondary | ICD-10-CM | POA: Diagnosis not present

## 2014-07-09 DIAGNOSIS — E119 Type 2 diabetes mellitus without complications: Secondary | ICD-10-CM | POA: Insufficient documentation

## 2014-07-09 DIAGNOSIS — M791 Myalgia: Secondary | ICD-10-CM | POA: Insufficient documentation

## 2014-07-09 DIAGNOSIS — C9000 Multiple myeloma not having achieved remission: Secondary | ICD-10-CM | POA: Diagnosis not present

## 2014-07-09 DIAGNOSIS — M7918 Myalgia, other site: Secondary | ICD-10-CM

## 2014-07-09 DIAGNOSIS — Z7982 Long term (current) use of aspirin: Secondary | ICD-10-CM | POA: Insufficient documentation

## 2014-07-09 DIAGNOSIS — Z79899 Other long term (current) drug therapy: Secondary | ICD-10-CM | POA: Insufficient documentation

## 2014-07-09 DIAGNOSIS — R109 Unspecified abdominal pain: Secondary | ICD-10-CM | POA: Diagnosis present

## 2014-07-09 LAB — CBC WITH DIFFERENTIAL/PLATELET
Basophils Absolute: 0.1 10*3/uL (ref 0.0–0.1)
Basophils Relative: 2 % — ABNORMAL HIGH (ref 0–1)
EOS PCT: 7 % — AB (ref 0–5)
Eosinophils Absolute: 0.4 10*3/uL (ref 0.0–0.7)
HCT: 42.6 % (ref 39.0–52.0)
HEMOGLOBIN: 13.8 g/dL (ref 13.0–17.0)
Lymphocytes Relative: 41 % (ref 12–46)
Lymphs Abs: 2.1 10*3/uL (ref 0.7–4.0)
MCH: 27.7 pg (ref 26.0–34.0)
MCHC: 32.4 g/dL (ref 30.0–36.0)
MCV: 85.5 fL (ref 78.0–100.0)
MONOS PCT: 10 % (ref 3–12)
Monocytes Absolute: 0.5 10*3/uL (ref 0.1–1.0)
Neutro Abs: 2 10*3/uL (ref 1.7–7.7)
Neutrophils Relative %: 40 % — ABNORMAL LOW (ref 43–77)
PLATELETS: 278 10*3/uL (ref 150–400)
RBC: 4.98 MIL/uL (ref 4.22–5.81)
RDW: 15.3 % (ref 11.5–15.5)
WBC: 5.1 10*3/uL (ref 4.0–10.5)

## 2014-07-09 LAB — URINALYSIS, ROUTINE W REFLEX MICROSCOPIC
Bilirubin Urine: NEGATIVE
Glucose, UA: >1000 mg/dL — AB
Hgb urine dipstick: NEGATIVE
KETONES UR: NEGATIVE mg/dL
Leukocytes, UA: NEGATIVE
NITRITE: NEGATIVE
PH: 5 (ref 5.0–8.0)
Protein, ur: NEGATIVE mg/dL
SPECIFIC GRAVITY, URINE: 1.028 (ref 1.005–1.030)
Urobilinogen, UA: 0.2 mg/dL (ref 0.0–1.0)

## 2014-07-09 LAB — COMPREHENSIVE METABOLIC PANEL
ALT: 35 U/L (ref 0–53)
AST: 41 U/L — ABNORMAL HIGH (ref 0–37)
Albumin: 3.7 g/dL (ref 3.5–5.2)
Alkaline Phosphatase: 75 U/L (ref 39–117)
Anion gap: 10 (ref 5–15)
BILIRUBIN TOTAL: 0.6 mg/dL (ref 0.3–1.2)
BUN: 9 mg/dL (ref 6–23)
CHLORIDE: 102 mmol/L (ref 96–112)
CO2: 24 mmol/L (ref 19–32)
Calcium: 8.8 mg/dL (ref 8.4–10.5)
Creatinine, Ser: 0.74 mg/dL (ref 0.50–1.35)
GFR calc non Af Amer: >90 mL/min (ref >90)
Glucose, Bld: 166 mg/dL — ABNORMAL HIGH (ref 70–99)
POTASSIUM: 3.7 mmol/L (ref 3.5–5.1)
SODIUM: 136 mmol/L (ref 135–145)
Total Protein: 6.5 g/dL (ref 6.0–8.3)

## 2014-07-09 LAB — URINE MICROSCOPIC-ADD ON

## 2014-07-09 NOTE — Discharge Instructions (Signed)

## 2014-07-09 NOTE — ED Provider Notes (Signed)
CSN: [id number]     Arrival date & time 07/09/14  1517 History   First MD Initiated Contact with Patient 07/09/14 1558     Chief Complaint  Patient presents with  . Flank Pain     (Consider location/radiation/quality/duration/timing/severity/associated sxs/prior Treatment) Patient is a 44 y.o. male presenting with flank pain. The history is provided by the patient.  Flank Pain This is a new problem. The current episode started yesterday. The problem occurs constantly. The problem has not changed since onset.Pertinent negatives include no chest pain and no abdominal pain. The symptoms are aggravated by bending. Relieved by: Ativan and Dilaudid pills. He has tried nothing for the symptoms.    Past Medical History  Diagnosis Date  . Kidney stone   . GERD (gastroesophageal reflux disease)   . Diabetes mellitus without complication     TYPE 2  . Cervical radiculopathy   . Lytic bone lesions on xray   . Multiple myeloma    Past Surgical History  Procedure Laterality Date  . Left arm pinning      fracture arm  . Pins removed      Rigth arm  . Rotator cuff repair      right  . Lumbar fusion  05/04/2012  . Back surgery    . Cervical fusion  July 2014  . Bone marrow biopsy  03/22/2014   Family History  Problem Relation Age of Onset  . Congestive Heart Failure Mother   . Panic disorder Mother   . Diabetes Father   . Congestive Heart Failure Father    History  Substance Use Topics  . Smoking status: Former Smoker -- 2.00 packs/day for 26 years    Types: Cigarettes    Quit date: 02/09/2007  . Smokeless tobacco: Never Used  . Alcohol Use: No    Review of Systems  Constitutional: Negative for fever and chills.  Cardiovascular: Negative for chest pain.  Gastrointestinal: Negative for abdominal pain.  Genitourinary: Positive for flank pain. Negative for difficulty urinating.       No incontinence, urinary retention, no fecal retention  All other systems reviewed and are  negative.     Allergies  Aleve and Zofran  Home Medications   Prior to Admission medications   Medication Sig Start Date End Date Taking? Authorizing Provider  acyclovir (ZOVIRAX) 400 MG tablet Take 1 tablet (400 mg total) by mouth 2 (two) times daily. 04/10/14   Aasim Marla Roe, MD  aspirin 81 MG tablet Take 81 mg by mouth daily.    Historical Provider, MD  diazepam (VALIUM) 5 MG tablet Take 1 tablet (5 mg total) by mouth at bedtime. 05/22/14   Heath Lark, MD  fentaNYL (DURAGESIC - DOSED MCG/HR) 25 MCG/HR patch Place 1 patch (25 mcg total) onto the skin every 3 (three) days. 07/07/14   Heath Lark, MD  gabapentin (NEURONTIN) 600 MG tablet Take 1 tablet (600 mg total) by mouth 3 (three) times daily. 06/29/14   Heath Lark, MD  HYDROmorphone (DILAUDID) 4 MG tablet Take 1 tablet (4 mg total) by mouth every 6 (six) hours as needed for severe pain. 06/15/14   Heath Lark, MD  insulin aspart protamine - aspart (NOVOLOG 70/30 MIX) (70-30) 100 UNIT/ML FlexPen Inject 15units before breakfast;  10 units before supper for DM Type 2 06/28/14 06/29/15  Historical Provider, MD  lenalidomide (REVLIMID) 25 MG capsule Take 1 capsule daily on days 1-21. Rest 7 and repeat every 28 days. 07/07/14   Heath Lark, MD  LORazepam (ATIVAN) 0.5 MG tablet Take 1 tablet (0.5 mg total) by mouth every 6 (six) hours as needed for anxiety. 06/15/14   Heath Lark, MD  metFORMIN (GLUCOPHAGE) 500 MG tablet Take 1,000 mg by mouth 2 (two) times daily with a meal.    Historical Provider, MD  prochlorperazine (COMPAZINE) 10 MG tablet Take 1 tablet (10 mg total) by mouth every 6 (six) hours as needed (Nausea or vomiting). 06/29/14   Heath Lark, MD  ranitidine (ZANTAC) 150 MG tablet Take 300 mg by mouth daily as needed for heartburn.     Historical Provider, MD   BP 138/78 mmHg  Pulse 97  Temp(Src) 98 F (36.7 C) (Oral)  Resp 18  Ht 6' (1.829 m)  Wt 213 lb (96.616 kg)  BMI 28.88 kg/m2  SpO2 99% Physical Exam  Constitutional: He is  oriented to person, place, and time. He appears well-developed and well-nourished. No distress.  HENT:  Head: Normocephalic and atraumatic.  Mouth/Throat: No oropharyngeal exudate.  Eyes: EOM are normal. Pupils are equal, round, and reactive to light.  Neck: Normal range of motion. Neck supple.  Cardiovascular: Normal rate and regular rhythm.  Exam reveals no friction rub.   No murmur heard. Pulmonary/Chest: Effort normal and breath sounds normal. No respiratory distress. He has no wheezes. He has no rales.  Abdominal: He exhibits no distension. There is no tenderness. There is no rebound.  Musculoskeletal: Normal range of motion. He exhibits no edema.       Back:  L leg with normal sensation, normal reflexes, no numbness.  Neurological: He is alert and oriented to person, place, and time.  Skin: He is not diaphoretic.  Nursing note and vitals reviewed.   ED Course  Procedures (including critical care time) Labs Review Labs Reviewed  CBC WITH DIFFERENTIAL/PLATELET - Abnormal; Notable for the following:    Neutrophils Relative % 40 (*)    Eosinophils Relative 7 (*)    Basophils Relative 2 (*)    All other components within normal limits  URINALYSIS, ROUTINE W REFLEX MICROSCOPIC - Abnormal; Notable for the following:    Glucose, UA >1000 (*)    All other components within normal limits  URINE MICROSCOPIC-ADD ON - Abnormal; Notable for the following:    Casts GRANULAR CAST (*)    All other components within normal limits  COMPREHENSIVE METABOLIC PANEL    Imaging Review Dg Thoracic Spine 2 View  07/09/2014   CLINICAL DATA:  Thoracic back pain. Current history of multiple myeloma.  EXAM: THORACIC SPINE - 2 VIEW  COMPARISON:  MRI thoracic spine 03/01/2014.  FINDINGS: Twelve rib-bearing thoracic vertebrae with anatomic alignment. No acute fractures. Osseous demineralization. The numerous myeloma lesions throughout the thoracic spine identified on the prior MRI apart difficult to  visualize on CT, though the lesion at T12 is vaguely visible. Prior C7 fusion appears solid.  IMPRESSION: 1. No acute osseous abnormality. 2. Osseous demineralization. Lytic lesion in the T12 vertebral body is vaguely visible. The multiple thoracic spine lesions identified on the prior MRI are difficult to visualize on the x-ray.   Electronically Signed   By: Evangeline Dakin M.D.   On: 07/09/2014 17:45   Dg Lumbar Spine 2-3 Views  07/09/2014   CLINICAL DATA:  Pain in the mid lower back. Edema on the left side. Previous history of low back surgery 2011. History of multiple myeloma.  EXAM: LUMBAR SPINE - 2-3 VIEW  COMPARISON:  Bone survey 03/16/2014.  FINDINGS: Postoperative changes with posterior  rod and screw fixation from L2 through L4. Compression of the L3 vertebral body with anterior hypertrophic changes. This is unchanged since previous study. Normal alignment of the lumbar spine. Mild anterior wedging of T12 is unchanged since prior study. Bone marrow appearance is more heterogeneous than on the previous study with suggestion of developing lytic lesions at T11, T12, and L1 levels. No acute fracture is demonstrated. Progressive narrowing of intervertebral disc space at L5-S1.  IMPRESSION: Normal alignment of the lumbar spine with unchanged appearance of T12 and L3 compression deformities. Postoperative changes are stable. Developing lucent lesions suggested at T11, T12, and L1 levels. No acute fractures identified.   Electronically Signed   By: Lucienne Capers M.D.   On: 07/09/2014 17:45   Dg Pelvis 1-2 Views  07/09/2014   CLINICAL DATA:  Left hip pain.  Low back pain.  Multiple myeloma.  EXAM: PELVIS - 1-2 VIEW  COMPARISON:  CT scan dated 03/22/2014 and radiograph dated 03/16/2014  FINDINGS: There is no fracture or acute bone destruction. Lytic lesions seen in the left femoral neck. There is also suggestion of the lesion in the right inferior pubic ramus.  There are new areas of lucency in the greater  trochanters bilaterally. Small synovial herniation pit in the right femoral neck.  The lesion in the left ischium is not visible on this exam.  IMPRESSION: No acute abnormality.  Progression of lytic lesions of myeloma.   Electronically Signed   By: Rozetta Nunnery M.D.   On: 07/09/2014 17:46     EKG Interpretation None      MDM   Final diagnoses:  Left hip pain  Musculoskeletal pain  Bony metastasis    38M with xh of multiple myeloma who just finished his 4th cycle of chemo here with L flank pain. Began yesterday, no instigating factors. Radiates down into his L posterior leg with some numbness/tingling. Denies any saddle anesthesia, urinary retention/urinary incontinence, fevers.  States feels like prior sciatica pain. Bone scan showed numerous mets in thoracic and lumbar spine and in L femoral neck. Will xray these areas today. He took ativan and dilaudid at home, so he didn't want any pain medicine here.  Imaging shows progressively worsening mets, mild. No acute fractures. Feeling ok, offered MR for further eval, he would rather follow up with his Oncologist tomorrow.    Evelina Bucy, MD 07/09/14 [id number]

## 2014-07-09 NOTE — ED Notes (Signed)
I gave the patient a cup of ice water per Dr. Mingo Amber.

## 2014-07-09 NOTE — ED Notes (Signed)
The pt has multiple myeloma and he has pain  And a nodule on his lt flank with pain that started today and the pain down his left   Leg.  He recently took his last chemo therapy treatment.  He is in oral chemo treatments.  No porta-cath or hickman

## 2014-07-10 ENCOUNTER — Ambulatory Visit (HOSPITAL_BASED_OUTPATIENT_CLINIC_OR_DEPARTMENT_OTHER): Payer: BC Managed Care – PPO | Admitting: Nurse Practitioner

## 2014-07-10 ENCOUNTER — Ambulatory Visit (HOSPITAL_COMMUNITY): Admission: RE | Admit: 2014-07-10 | Payer: BC Managed Care – PPO | Source: Ambulatory Visit

## 2014-07-10 ENCOUNTER — Telehealth: Payer: Self-pay | Admitting: *Deleted

## 2014-07-10 ENCOUNTER — Encounter: Payer: Self-pay | Admitting: Nurse Practitioner

## 2014-07-10 ENCOUNTER — Other Ambulatory Visit: Payer: Self-pay | Admitting: Nurse Practitioner

## 2014-07-10 ENCOUNTER — Ambulatory Visit (HOSPITAL_COMMUNITY)
Admission: RE | Admit: 2014-07-10 | Discharge: 2014-07-10 | Disposition: A | Discharge status: Home / Self Care | Payer: BC Managed Care – PPO | Source: Ambulatory Visit | Attending: Nurse Practitioner | Admitting: Nurse Practitioner

## 2014-07-10 ENCOUNTER — Telehealth: Payer: Self-pay | Admitting: Hematology and Oncology

## 2014-07-10 VITALS — BP 126/73 | HR 79 | Temp 97.5°F | Resp 18

## 2014-07-10 DIAGNOSIS — C9000 Multiple myeloma not having achieved remission: Secondary | ICD-10-CM | POA: Diagnosis present

## 2014-07-10 DIAGNOSIS — C7951 Secondary malignant neoplasm of bone: Secondary | ICD-10-CM

## 2014-07-10 DIAGNOSIS — M4806 Spinal stenosis, lumbar region: Secondary | ICD-10-CM | POA: Insufficient documentation

## 2014-07-10 DIAGNOSIS — M5124 Other intervertebral disc displacement, thoracic region: Secondary | ICD-10-CM | POA: Diagnosis not present

## 2014-07-10 DIAGNOSIS — Z981 Arthrodesis status: Secondary | ICD-10-CM | POA: Diagnosis not present

## 2014-07-10 DIAGNOSIS — R202 Paresthesia of skin: Secondary | ICD-10-CM

## 2014-07-10 DIAGNOSIS — G893 Neoplasm related pain (acute) (chronic): Secondary | ICD-10-CM

## 2014-07-10 DIAGNOSIS — E119 Type 2 diabetes mellitus without complications: Secondary | ICD-10-CM

## 2014-07-10 MED ORDER — DEXAMETHASONE 4 MG PO TABS: 4.0000 mg | ORAL_TABLET | Freq: Two times a day (BID) | ORAL | Status: DC

## 2014-07-10 MED ORDER — GADOBENATE DIMEGLUMINE 529 MG/ML IV SOLN
20.0000 mL | Freq: Once | INTRAVENOUS | Status: AC | PRN
  Administered 2014-07-10: 20 mL via INTRAVENOUS

## 2014-07-10 NOTE — Progress Notes (Signed)
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 assessed. CT angiogram of the chest may be considered for further delineation.  MR LUMBAR SPINE  Remote compression fracture of the L3 vertebral body with retropulsion of the compressed vertebra treated with fusion  from the L2 through L4 with overall appearance similar to the prior postmyelogram CT.  Regions of altered signal intensity most consistent with involvement by myeloma involving the left L1 pedicle, possibly the mid aspect of the L2 vertebral body (metallic artifact limits evaluation), anterior inferior aspect of the L4 vertebral body, right S1 vertebral body junction with the facet, right aspect of S2, mid aspect S3 and left ilium. As prior lumbar spine MR is not available, it is difficult to determine if any of these lesions are new. There is however, no evidence of epidural spread of tumor causing cord/conus or nerve root compression.  L4-5 multifactorial mild to slightly moderate spinal stenosis. Prominent degenerative changes at the L4-L5 spinous process articulation with small amount of fluid and mild enhancement. This is felt less likely to result of myeloma and was noted on the postmyelogram CT.  L5-S1 bold minimally more notable to left with  slight encroachment upon but not compression of the origin of the left S1 nerve root. No significant thecal sac narrowing. Mild foraminal narrowing greater on the left approaching but not compressing the exiting L5 nerve roots.   Electronically Signed   By: Chauncey Cruel M.D.   On: 07/10/2014 16:56    ASSESSMENT/PLAN:    Multiple myeloma Currently undergoing maintenance Revlimid therapy only.  Patient states that he will complete this cycle of Revlimid at the end of this week; and will then have a week off prior to initiating his next cycle of Revlimid.  Patient also receives Zometa infusions for his previously diagnosed bone metastasis.  He last received Zometa on 06/19/2014.  He will be due for his next cycle of Zometa the second week of April 2016.(Zometa infusion every 3 months).   He is scheduled to return for labs and a follow-up visit on 08/11/2014.   Neoplasm related pain Patient with chronic back pain from an previous injury prior to his multiple myeloma diagnosis.  He also has known metastasis to both his thoracic and lumbar spine.  Patient currently wears a fentanyl 25 g patch, and takes Dilaudid for breakthrough pain.  He also has Neurontin and Valium for pain control.  Patient states his pain has slightly increased to his mid to lower left back for the past 24-48 hours.  Patient states that he has been able to manage his pain with his pain medicine.  He has a home.   Paresthesia Patient is reporting 24-48 hour history of slight increased left lower back pain with radiation of pain down his left leg.  He is also complaining of some numbness/tingling to his left lower extremity down to his toes as well.  He denies any known injury or trauma to his back recently.  Patient denies any UTI symptoms.  He denies any recent fevers or chills.  Of note-patient has recently discontinued Velcade and dexamethasone.  Discontinuation of his dexamethasone could be causing patient's increased back  pain.  MRI with contrast of both the thoracic and lumbar regions revealed no cord compression; but myeloma involvement of the T7 vertebral body with expansion of the left posterior inferior aspect of the T breath does cause some narrowing of the left ventral aspect of the thecal sac with minimal left-sided cord flattening similar to prior exam.  The plan is for the patient to initiate dexamethasone once again at 4 mg twice daily to see if this helps with his pain/discomfort.  Will also obtain a radiation oncology consult to see if palliative radiation is an option  for the patient.  Also, since patient is a diabetic-did advise patient to keep a close eye on his blood sugar since dexamethasone may very well increased patient's blood sugar readings.  Advised both patient and his wife to call/return to go directed to the emergency department overnight if he develops any worsening symptoms whatsoever.      Bone metastases Patient has known bone metastasis to both the thoracic and lumbar region; as well as multiple ribs.  Patient is currently undergoing Zometa infusions on an every three-month basis.  His last Zometa infusion was 06/19/2014.  He'll be due for his next Zometa infusion in mid April 2016.   Patient stated understanding of all instructions; and was in agreement with this plan of care. The patient knows to call the clinic with any problems, questions or concerns.   This was a shared visit with Dr Burr Medico (on call for Dr. Alvy Bimler) today.   Total time spent with patient was 40 minutes;  with greater than 75 percent of that time spent in face to face counseling regarding patient's symptoms,  and coordination of care and follow up.  Disclaimer: This note was dictated with voice recognition software. Similar sounding words can inadvertently be transcribed and may not be corrected upon review.   Drue Second, NP 07/10/2014   Attending addendum: I have seen the patient, examined him. I agree  with the assessment and and plan and have edited the notes.   I reviewed his MRI scan and explained the results to patient and his wife. Although there is no cord compression on MRI, given his relatively rapid onset of symptoms, we'll refer him to rad/onc tomorrow for urgent positive radiation.   Truitt Merle

## 2014-07-10 NOTE — Telephone Encounter (Signed)
pt called to get appt due to just getting out of the hospital....MD is out of the office all week...transferred pt to triage

## 2014-07-10 NOTE — Assessment & Plan Note (Addendum)
Currently undergoing maintenance Revlimid therapy only.  Patient states that he will complete this cycle of Revlimid at the end of this week; and will then have a week off prior to initiating his next cycle of Revlimid.  Patient also receives Zometa infusions for his previously diagnosed bone metastasis.  He last received Zometa on 06/19/2014.  He will be due for his next cycle of Zometa the second week of April 2016.(Zometa infusion every 3 months).   He is scheduled to return for labs and a follow-up visit on 08/11/2014.

## 2014-07-10 NOTE — Telephone Encounter (Signed)
Instructed by Selena Lesser, NP, to call patient and inform him of a MRI today at [facility]. Patient needs to arrive at 11:45 am. Patient informed me that he does have metal in his neck and back, but he has had MRI's since the metal was placed. I called Radiology department at [facility] and informed them. Patient instructed to have wife drive him to MRI, and then come to the Cancer center and wait for results. Patient verbalized understanding.

## 2014-07-10 NOTE — Assessment & Plan Note (Addendum)
Patient is reporting 24-48 hour history of slight increased left lower back pain with radiation of pain down his left leg.  He is also complaining of some numbness/tingling to his left lower extremity down to his toes as well.  He denies any known injury or trauma to his back recently.  Patient denies any UTI symptoms.  He denies any recent fevers or chills.  Of note-patient has recently discontinued Velcade and dexamethasone.  Discontinuation of his dexamethasone could be causing patient's increased back pain.  MRI with contrast of both the thoracic and lumbar regions revealed no cord compression; but myeloma involvement of the T7 vertebral body with expansion of the left posterior inferior aspect of the T breath does cause some narrowing of the left ventral aspect of the thecal sac with minimal left-sided cord flattening similar to prior exam.  The plan is for the patient to initiate dexamethasone once again at 4 mg twice daily to see if this helps with his pain/discomfort.  Will also obtain a radiation oncology consult to see if palliative radiation is an option for the patient.  Also, since patient is a diabetic-did advise patient to keep a close eye on his blood sugar since dexamethasone may very well increased patient's blood sugar readings.  Advised both patient and his wife to call/return to go directed to the emergency department overnight if he develops any worsening symptoms whatsoever.

## 2014-07-10 NOTE — Assessment & Plan Note (Signed)
Patient has known bone metastasis to both the thoracic and lumbar region; as well as multiple ribs.  Patient is currently undergoing Zometa infusions on an every three-month basis.  His last Zometa infusion was 06/19/2014.  He'll be due for his next Zometa infusion in mid April 2016.

## 2014-07-10 NOTE — Assessment & Plan Note (Signed)
Patient with chronic back pain from an previous injury prior to his multiple myeloma diagnosis.  He also has known metastasis to both his thoracic and lumbar spine.  Patient currently wears a fentanyl 25 g patch, and takes Dilaudid for breakthrough pain.  He also has Neurontin and Valium for pain control.  Patient states his pain has slightly increased to his mid to lower left back for the past 24-48 hours.  Patient states that he has been able to manage his pain with his pain medicine.  He has a home.

## 2014-07-10 NOTE — Telephone Encounter (Signed)
@  LOGO@ Provider input needed: POSSIBLE VISIT.  Patient Name: Dylan Benson  MRN: [id number] DOB: 1970-08-13  Date: @TODAY @ Telephone: [phone] (home) [phone] (work) CSN: [id number]   Allergies: is allergic to aleve and zofran.      Reason for call: SHOOTING PAIN DOWN LEFT LEG.  Chief Complaint  Patient presents with  . Appointment    PT. WAS SEEN IN THE [place] [facility] ON 07/09/14. RECOMMEND AN MRI       Patient last received chemotherapy/ treatment on: PT. IS TAKING REVLIMID.  Patient was last seen in the office on 07/07/14.  Next appt is 08/11/14.      Is patient having fevers greater than 100.5? Yes []    No []    Comments:    Is patient having uncontrolled pain, or new pain? Yes [x]    No []    Comments:PT.'S PAIN IS AT A SCALE OF SEVEN. HIS PAIN MEDICATION ONLY "DULLS" THE PAIN. HE ALSO HAS A KNOT MID BACK AND TO THE LEFT. NO PROBLEMS WALKING, URINATING, OR PASSING STOOL.   Is patient having new back pain that changes with position Yes [x]                                   (worsens or eases when laying down?) No []    Comments:PAIN EASES WHEN HE IS LAYING DOWN WITH A HEATING PAD.     Is patient able to eat and drink? Yes []    No []    Comments:    Is patient able to pass stool without difficulty?  Yes [x]    No []    Comments:     Is patient having uncontrolled nausea?  Yes []    No []    Comments:  Summary Based on the above information advised patient to  AWAIT A CALL.   @[facility] @  07/10/2014, 9:02 AM

## 2014-07-11 NOTE — Progress Notes (Signed)
Histology and Location of Primary Cancer: Multiple Myeloma, IgG Kappa Myeloma stage I  ED Visit 07/09/14:Sites of Visceral and Bony Metastatic Disease: 07/09/14  nodule on his lt flank with pain that started today and the pain down his leftLeg. Presented to the   MRI 07/10/14:  Multiple osseous lesions throughout the thoracic vertebra. Myeloma involvement of the T7 vertebral body with expansion of the left posterior inferior aspect causes narrowing of the left ventral aspect of the thecal sac with minimal left-sided cord flattening similar to prior exam. There is a small left paracentral T7-8 disc protrusion just below this level.  Multiple osseous lesions throughout the thoracic vertebra once again noted consistent with patient's history myeloma. No new thoracic spine lesion identified with some of the lesions appearing slightly smaller than on the prior exam.  Regions of altered signal intensity most consistent with involvement by myeloma involving the left L1 pedicle, possibly the mid aspect of the L2 vertebral body (metallic artifact limits evaluation), anterior inferior aspect of the L4 vertebral body, right S1 vertebral body junction with the facet, right aspect of S2, mid aspect S3 and left ilium. As prior lumbar spine MR is not available, it is difficult to determine if any of these lesions are new. There is however, no evidence of epidural spread of tumor causing cord/conus or nerve root compression.  On 2/8 seen by Retta Mac: increased lower back pain that is now radiating down his left leg. He is also noted some new onset left leg numbness/tingling as well.   Location(s) of Symptomatic Metastases: T7  Past/Anticipated chemotherapy by medical oncology, if any: Patient is status post Revlimid/Velcade/dexamethasone therapy. Currently undergoing maintenance Revlimid and Zometa therapy.   Pain on a scale of 0-10 is: 7    If Spine Met(s), symptoms, if any, include:  Bowel/Bladder retention  or incontinence (please describe): None  Numbness or weakness in extremities (please describe):Numbness from left thigh to foot.  Gait steady  Current Decadron regimen, if applicable:07/10/13 Decadron 22m po BID  Ambulatory status? Walker? Wheelchair?: Ambulatory  SAFETY ISSUES:  Prior radiation?No  Pacemaker/ICD? No  Possible current pregnancy? N/A  Is the patient on methotrexate? No   Current Complaints / other details:   Patient has chronic back pain from a previous injury prior to his myeloma diagnosis.  Patient takes fentanyl 25 g patches, Dilaudid for breakthrough pain, Neurontin, and Valium for his pain management.

## 2014-07-12 ENCOUNTER — Ambulatory Visit
Admission: RE | Admit: 2014-07-12 | Discharge: 2014-07-12 | Disposition: A | Discharge status: Home / Self Care | Payer: BC Managed Care – PPO | Source: Ambulatory Visit | Attending: Radiation Oncology | Admitting: Radiation Oncology

## 2014-07-12 ENCOUNTER — Encounter: Payer: Self-pay | Admitting: Radiation Oncology

## 2014-07-12 VITALS — BP 127/78 | HR 88 | Temp 98.2°F | Ht 72.0 in | Wt 216.3 lb

## 2014-07-12 DIAGNOSIS — Z7982 Long term (current) use of aspirin: Secondary | ICD-10-CM | POA: Insufficient documentation

## 2014-07-12 DIAGNOSIS — M5416 Radiculopathy, lumbar region: Secondary | ICD-10-CM | POA: Insufficient documentation

## 2014-07-12 DIAGNOSIS — M5412 Radiculopathy, cervical region: Secondary | ICD-10-CM

## 2014-07-12 DIAGNOSIS — Z981 Arthrodesis status: Secondary | ICD-10-CM | POA: Diagnosis not present

## 2014-07-12 DIAGNOSIS — C9000 Multiple myeloma not having achieved remission: Secondary | ICD-10-CM

## 2014-07-12 DIAGNOSIS — Z79899 Other long term (current) drug therapy: Secondary | ICD-10-CM | POA: Insufficient documentation

## 2014-07-12 DIAGNOSIS — Z510 Encounter for antineoplastic radiation therapy: Secondary | ICD-10-CM | POA: Insufficient documentation

## 2014-07-12 DIAGNOSIS — Z794 Long term (current) use of insulin: Secondary | ICD-10-CM | POA: Diagnosis not present

## 2014-07-12 DIAGNOSIS — E119 Type 2 diabetes mellitus without complications: Secondary | ICD-10-CM | POA: Insufficient documentation

## 2014-07-12 DIAGNOSIS — Z87891 Personal history of nicotine dependence: Secondary | ICD-10-CM | POA: Insufficient documentation

## 2014-07-12 DIAGNOSIS — K219 Gastro-esophageal reflux disease without esophagitis: Secondary | ICD-10-CM | POA: Insufficient documentation

## 2014-07-12 DIAGNOSIS — Z7952 Long term (current) use of systemic steroids: Secondary | ICD-10-CM | POA: Diagnosis not present
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
## 2014-07-13 ENCOUNTER — Telehealth: Payer: Self-pay | Admitting: *Deleted

## 2014-07-13 ENCOUNTER — Encounter: Payer: Self-pay | Admitting: Hematology and Oncology

## 2014-07-13 ENCOUNTER — Encounter: Payer: Self-pay | Admitting: Nurse Practitioner

## 2014-07-13 NOTE — Telephone Encounter (Signed)
VM from [place] at [facility] ph #[phone].  She states they have learned pt is going to undergo Radiation Tx at [facility].  She asks if Dr. Alvy Bimler can do the [facility] after pt is completed w/ XRT and "his counts have recovered."  This would be in preparation for Stem cell Transplant.

## 2014-07-13 NOTE — Telephone Encounter (Signed)
Left VM for Transplant Coordinator, Jacqlyn Larsen, at [facility] #[phone].   Informed her that pt is not going to have Radiation Tx as she thought.  Asked her if they still need Dr. Alvy Bimler to do BMBx here or will they do that at their Hospital?  Asked her to call us back if we can help.Marland Kitchen

## 2014-07-14 ENCOUNTER — Encounter: Payer: Self-pay | Admitting: Hematology and Oncology

## 2014-07-14 NOTE — Addendum Note (Signed)
Encounter addended by: Deirdre Evener, RN on: 07/14/2014 12:25 PM<BR>     Documentation filed: Charges VN

## 2014-07-17 ENCOUNTER — Telehealth: Payer: Self-pay | Admitting: *Deleted

## 2014-07-17 ENCOUNTER — Encounter: Payer: Self-pay | Admitting: Hematology and Oncology

## 2014-07-17 ENCOUNTER — Telehealth: Payer: Self-pay | Admitting: Hematology and Oncology

## 2014-07-17 ENCOUNTER — Ambulatory Visit (HOSPITAL_BASED_OUTPATIENT_CLINIC_OR_DEPARTMENT_OTHER): Payer: BC Managed Care – PPO | Admitting: Hematology and Oncology

## 2014-07-17 VITALS — BP 123/66 | HR 56 | Temp 98.3°F | Resp 20 | Ht 72.0 in | Wt 213.5 lb

## 2014-07-17 DIAGNOSIS — C7951 Secondary malignant neoplasm of bone: Secondary | ICD-10-CM

## 2014-07-17 DIAGNOSIS — E119 Type 2 diabetes mellitus without complications: Secondary | ICD-10-CM

## 2014-07-17 DIAGNOSIS — G893 Neoplasm related pain (acute) (chronic): Secondary | ICD-10-CM

## 2014-07-17 DIAGNOSIS — C9000 Multiple myeloma not having achieved remission: Secondary | ICD-10-CM

## 2014-07-17 MED ORDER — HYDROMORPHONE HCL 4 MG PO TABS: 4.0000 mg | ORAL_TABLET | Freq: Four times a day (QID) | ORAL | Status: DC | PRN

## 2014-07-17 NOTE — Telephone Encounter (Signed)
-----   Message from Heath Lark, MD sent at 07/17/2014 12:02 PM EST ----- Regarding: noted new problems I noted new problems recently. Can you call to see if he can come in today at 330 pm or Thursday? No labs, just to discuss next step

## 2014-07-17 NOTE — Assessment & Plan Note (Addendum)
Sincerely was last seen here, he has severe pain and recent imaging show possible impeding fracture to the left femoral neck. I spoke with his transplant coordinator to hold off further plan for transplant. I will discontinue dexamethasone and Revlimid in anticipation for urgent surgery to prevent possible left femoral neck fracture. I refer him to an orthopedic surgeon for surgery. Currently, his pain is under good control with current pain medication and I recommend holding off radiation treatment. I will see him back in a month for further assessment and supportive care. He agreed with the plan.

## 2014-07-17 NOTE — Progress Notes (Signed)
Dylan Benson OFFICE PROGRESS NOTE  Patient Care Team: Jeanelle Malling, PA-C as PCP - General  SUMMARY OF ONCOLOGIC HISTORY:   Multiple myeloma   03/16/2014 Imaging Skeletal survey showed Multiple osseous lesions as as above supporting diagnosis of multiple myeloma   03/22/2014 Bone Marrow Biopsy Accession: [id number] bone marrow biopsy, from multiple myeloma.   04/10/2014 - 06/26/2014 Chemotherapy He received induction chemotherapy with Revlimid, Velcade and dexamethasone.   06/12/2014 - 07/10/2014 Chemotherapy He received maintenance treatment with Revlimid alone. Treatment was held in anticipation for surgery.   07/10/2014 Imaging CT scan and MRI show significant compression fractures and impeding fracture in the left femoral neck.    INTERVAL HISTORY: Please see below for problem oriented charting. He is seen urgently today because of worsening pain and recent imaging findings. He stated that he has a lot of hip pain centered around the left hip area. This is still controlled with current pain medications. He is supposed to go back to [facility] tomorrow for bone marrow transplant test. He denied recent infection. He has completed recent cycle of Revlimid.  REVIEW OF SYSTEMS:   Constitutional: Denies fevers, chills or abnormal weight loss Eyes: Denies blurriness of vision Ears, nose, mouth, throat, and face: Denies mucositis or sore throat Respiratory: Denies cough, dyspnea or wheezes Cardiovascular: Denies palpitation, chest discomfort or lower extremity swelling Gastrointestinal:  Denies nausea, heartburn or change in bowel habits Skin: Denies abnormal skin rashes Lymphatics: Denies new lymphadenopathy or easy bruising Neurological:Denies numbness, tingling or new weaknesses Behavioral/Psych: Mood is stable, no new changes  All other systems were reviewed with the patient and are negative.  I have reviewed the past medical history, past surgical history, social  history and family history with the patient and they are unchanged from previous note.  ALLERGIES:  is allergic to aleve and zofran.  MEDICATIONS:  Current Outpatient Prescriptions  Medication Sig Dispense Refill  . acyclovir (ZOVIRAX) 400 MG tablet Take 1 tablet (400 mg total) by mouth 2 (two) times daily. 60 tablet 2  . aspirin 81 MG tablet Take 81 mg by mouth daily.    Marland Kitchen dexamethasone (DECADRON) 4 MG tablet Take 1 tablet (4 mg total) by mouth 2 (two) times daily. 20 tablet 1  . diazepam (VALIUM) 5 MG tablet Take 1 tablet (5 mg total) by mouth at bedtime. 30 tablet 1  . fentaNYL (DURAGESIC - DOSED MCG/HR) 25 MCG/HR patch Place 1 patch (25 mcg total) onto the skin every 3 (three) days. 10 patch 0  . gabapentin (NEURONTIN) 600 MG tablet Take 1 tablet (600 mg total) by mouth 3 (three) times daily. 90 tablet 0  . HYDROmorphone (DILAUDID) 4 MG tablet Take 1 tablet (4 mg total) by mouth every 6 (six) hours as needed for severe pain. 90 tablet 0  . insulin aspart protamine - aspart (NOVOLOG 70/30 MIX) (70-30) 100 UNIT/ML FlexPen Inject 15units before breakfast;  10 units before supper for DM Type 2    . lenalidomide (REVLIMID) 25 MG capsule Take 1 capsule daily on days 1-21. Rest 7 and repeat every 28 days. (Patient taking differently: Take 25 mg by mouth See admin instructions. Take 1 capsule daily on days 1-21. Rest 7 and repeat every 28 days.) 21 capsule 0  . LORazepam (ATIVAN) 0.5 MG tablet Take 1 tablet (0.5 mg total) by mouth every 6 (six) hours as needed for anxiety. 60 tablet 0  . metFORMIN (GLUCOPHAGE) 500 MG tablet Take 1,000 mg by mouth 2 (two)  times daily with a meal.    . prochlorperazine (COMPAZINE) 10 MG tablet Take 1 tablet (10 mg total) by mouth every 6 (six) hours as needed (Nausea or vomiting). 30 tablet 1  . ranitidine (ZANTAC) 150 MG tablet Take 300 mg by mouth daily as needed for heartburn.      No current facility-administered medications for this visit.    PHYSICAL  EXAMINATION: ECOG PERFORMANCE STATUS: 0 - Asymptomatic  Filed Vitals:   07/17/14 1519  BP: 123/66  Pulse: 56  Temp: 98.3 F (36.8 C)  Resp: 20   Filed Weights   07/17/14 1519  Weight: 213 lb 8 oz (96.843 kg)    GENERAL:alert, no distress and comfortable SKIN: skin color, texture, turgor are normal, no rashes or significant lesions EYES: normal, Conjunctiva are pink and non-injected, sclera clear OROPHARYNX:no exudate, no erythema and lips, buccal mucosa, and tongue normal  NECK: supple, thyroid normal size, non-tender, without nodularity LYMPH:  no palpable lymphadenopathy in the cervical, axillary or inguinal LUNGS: clear to auscultation and percussion with normal breathing effort HEART: regular rate & rhythm and no murmurs and no lower extremity edema ABDOMEN:abdomen soft, non-tender and normal bowel sounds Musculoskeletal:no cyanosis of digits and no clubbing  NEURO: alert & oriented x 3 with fluent speech, no focal motor/sensory deficits  LABORATORY DATA:  I have reviewed the data as listed    Component Value Date/Time   NA 136 07/09/2014 1528   NA 136 06/26/2014 1419   K 3.7 07/09/2014 1528   K 3.7 06/26/2014 1419   CL 102 07/09/2014 1528   CO2 24 07/09/2014 1528   CO2 23 06/26/2014 1419   GLUCOSE 166* 07/09/2014 1528   GLUCOSE 154* 06/26/2014 1419   BUN 9 07/09/2014 1528   BUN 9.4 06/26/2014 1419   CREATININE 0.74 07/09/2014 1528   CREATININE 0.7 06/26/2014 1419   CALCIUM 8.8 07/09/2014 1528   CALCIUM 9.3 06/26/2014 1419   PROT 6.5 07/09/2014 1528   PROT 7.1 06/26/2014 1419   ALBUMIN 3.7 07/09/2014 1528   ALBUMIN 3.6 06/26/2014 1419   AST 41* 07/09/2014 1528   AST 40* 06/26/2014 1419   ALT 35 07/09/2014 1528   ALT 37 06/26/2014 1419   ALKPHOS 75 07/09/2014 1528   ALKPHOS 89 06/26/2014 1419   BILITOT 0.6 07/09/2014 1528   BILITOT 0.45 06/26/2014 1419   GFRNONAA >90 07/09/2014 1528   GFRAA >90 07/09/2014 1528    No results found for: SPEP,  UPEP  Lab Results  Component Value Date   WBC 5.1 07/09/2014   NEUTROABS 2.0 07/09/2014   HGB 13.8 07/09/2014   HCT 42.6 07/09/2014   MCV 85.5 07/09/2014   PLT 278 07/09/2014      Chemistry      Component Value Date/Time   NA 136 07/09/2014 1528   NA 136 06/26/2014 1419   K 3.7 07/09/2014 1528   K 3.7 06/26/2014 1419   CL 102 07/09/2014 1528   CO2 24 07/09/2014 1528   CO2 23 06/26/2014 1419   BUN 9 07/09/2014 1528   BUN 9.4 06/26/2014 1419   CREATININE 0.74 07/09/2014 1528   CREATININE 0.7 06/26/2014 1419      Component Value Date/Time   CALCIUM 8.8 07/09/2014 1528   CALCIUM 9.3 06/26/2014 1419   ALKPHOS 75 07/09/2014 1528   ALKPHOS 89 06/26/2014 1419   AST 41* 07/09/2014 1528   AST 40* 06/26/2014 1419   ALT 35 07/09/2014 1528   ALT 37 06/26/2014 1419  BILITOT 0.6 07/09/2014 1528   BILITOT 0.45 06/26/2014 1419     RADIOGRAPHIC STUDIES: I have reviewed numerous imaging studies with the patient and his wife. I have personally reviewed the radiological images as listed and agreed with the findings in the report.  ASSESSMENT & PLAN:  Multiple myeloma Sincerely was last seen here, he has severe pain and recent imaging show possible impeding fracture to the left femoral neck. I spoke with his transplant coordinator to hold off further plan for transplant. I will discontinue dexamethasone and Revlimid in anticipation for urgent surgery to prevent possible left femoral neck fracture. I refer him to an orthopedic surgeon for surgery. Currently, his pain is under good control with current pain medication and I recommend holding off radiation treatment. I will see him back in a month for further assessment and supportive care. He agreed with the plan.   Bone metastases He has severe bone pain from myeloma and chronic back pain from prior injury. I refill his prescription Dilaudid. We discussed narcotic refill policy. He will continue Zometa every 3 months and will  remain on calcium with vitamin D supplement.      Type 2 diabetes mellitus without complication His hyperglycemia was recently exacerbated by steroids. I recommend dietary modification. This has improved.      Orders Placed This Encounter  Procedures  . AMB referral to orthopedics    Referral Priority:  Urgent    Referral Type:  Consultation    Number of Visits Requested:  1   All questions were answered. The patient knows to call the clinic with any problems, questions or concerns. No barriers to learning was detected. I spent 30 minutes counseling the patient face to face. The total time spent in the appointment was 40 minutes and more than 50% was on counseling and review of test results     [facility], [place], MD 07/17/2014 4:33 PM

## 2014-07-17 NOTE — Telephone Encounter (Signed)
Gave avs & calendar for March. Referral to ortho was not open due to weather.

## 2014-07-17 NOTE — Telephone Encounter (Signed)
Pt says he can come in today at 3:30 pm to see Dr. Alvy Bimler.  Request sent to Scheduler.

## 2014-07-17 NOTE — Assessment & Plan Note (Signed)
His hyperglycemia was recently exacerbated by steroids. I recommend dietary modification. This has improved.

## 2014-07-17 NOTE — Assessment & Plan Note (Signed)
He has severe bone pain from myeloma and chronic back pain from prior injury. I refill his prescription Dilaudid. We discussed narcotic refill policy. He will continue Zometa every 3 months and will remain on calcium with vitamin D supplement.

## 2014-07-18 ENCOUNTER — Telehealth: Payer: Self-pay | Admitting: Hematology and Oncology

## 2014-07-18 NOTE — Telephone Encounter (Signed)
S/w staff member at [facility] scheduled pt in the morning 02/17 at 9 pt confirmed faxed over notes, demographics, referral order, ins card..... KJ

## 2014-07-19 ENCOUNTER — Telehealth: Payer: Self-pay | Admitting: *Deleted

## 2014-07-19 NOTE — Telephone Encounter (Signed)
Most sore throats are viral. Try salt water gargle and rest. Call back Monday if not better

## 2014-07-19 NOTE — Telephone Encounter (Signed)
Called back Butch Penny and notified her of information noted below by Dr. Alvy Bimler. Butch Penny agreeable to pass information along to Brysin and will call back if he develops mouth sores or sore throat is not better by Monday.

## 2014-07-19 NOTE — Telephone Encounter (Signed)
Patient's wife, Butch Penny, called to [facility] stating that patient had an appointment with orthopedist today but the appt was cancelled because that MD does not specialize in hips. Butch Penny states her husband has a new appt with a different MD next Thursday, 07/27/14 at 3pm. She wanted to make Dr. Alvy Bimler aware that the appt is not until next week.  She also wanted to let Dr. Alvy Bimler know that Dylan Benson started having a sore throat last night and when he woke up this morning it was very sore. Patient denies any mouth sores. She is wondering if anything needs to be done about this.  Messaged routed to Dr. Alvy Bimler.

## 2014-07-20 ENCOUNTER — Encounter: Payer: Self-pay | Admitting: *Deleted

## 2014-07-20 ENCOUNTER — Encounter: Payer: Self-pay | Admitting: Hematology and Oncology

## 2014-07-20 NOTE — Telephone Encounter (Addendum)
Call received from spouse, Butch Penny requesting a "letter note for Nijel to be out of work.  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  Appointment scheduled 3:00 pm 07-26-2014 with surgeon for bone pain from Myeloma.  Needs a letter faxed "to whom it may concern" for coverage out of work at least until he see's surgeon."  Butch Penny can be reached at [phone].  Would like a note to cover beginning tomorrow.  Asked letter be faxed to [phone].  Will notify Dr. Alvy Bimler.    Collaborative nurse reports Dr. Alvy Bimler will return tomorrow.  Needed to know when surgeon F/U will occur which this nurse included in initial addendum.

## 2014-07-21 ENCOUNTER — Telehealth: Payer: Self-pay | Admitting: *Deleted

## 2014-07-21 NOTE — Telephone Encounter (Signed)
Left VM for pt informing him of letter for work faxed.  S/w wife and she says she has gotten the letter already.  She says pt needs to bring copy of MRI to Ortho next week.  Instructed her to pick it up at [facility]..  I will call to request they put it on disc.  She verbalized understanding. Called Radiology and requested MRI on CD for pt/wife to pick up.Marland Kitchen

## 2014-07-23 ENCOUNTER — Other Ambulatory Visit: Payer: Self-pay | Admitting: Hematology and Oncology

## 2014-07-24 ENCOUNTER — Other Ambulatory Visit: Payer: Self-pay | Admitting: *Deleted

## 2014-07-24 DIAGNOSIS — C9000 Multiple myeloma not having achieved remission: Secondary | ICD-10-CM

## 2014-07-24 DIAGNOSIS — M5412 Radiculopathy, cervical region: Secondary | ICD-10-CM

## 2014-07-24 DIAGNOSIS — F419 Anxiety disorder, unspecified: Secondary | ICD-10-CM

## 2014-07-24 MED ORDER — GABAPENTIN 600 MG PO TABS: 600.0000 mg | ORAL_TABLET | Freq: Three times a day (TID) | ORAL | Status: DC

## 2014-07-24 MED ORDER — LORAZEPAM 0.5 MG PO TABS: 0.5000 mg | ORAL_TABLET | Freq: Four times a day (QID) | ORAL | Status: DC | PRN

## 2014-07-24 MED ORDER — PROCHLORPERAZINE MALEATE 10 MG PO TABS: 10.0000 mg | ORAL_TABLET | Freq: Four times a day (QID) | ORAL | Status: DC | PRN

## 2014-07-26 ENCOUNTER — Encounter (HOSPITAL_COMMUNITY): Payer: Self-pay | Admitting: *Deleted

## 2014-07-26 NOTE — Progress Notes (Signed)
   07/26/14 1651  OBSTRUCTIVE SLEEP APNEA  Have you ever been diagnosed with sleep apnea through a sleep study? No  Do you snore loudly (loud enough to be heard through closed doors)?  1  Do you often feel tired, fatigued, or sleepy during the daytime? 1  Has anyone observed you stop breathing during your sleep? 0  Do you have, or are you being treated for high blood pressure? 0  BMI more than 35 kg/m2? 0  Age over 46 years old? 0  Neck circumference greater than 40 cm/16 inches? 1  Gender: 1

## 2014-07-26 NOTE — H&P (Signed)
Dylan Benson is an 44 y.o. male.   Chief Complaint: left hip pain HPI: Diagnosed with MM in 03/2014. Has 2 weeks of worsening left hip pain. Xrays ordered by oncologist showed large lytic lesion to left femoral neck.  Past Medical History  Diagnosis Date  . Kidney stone   . GERD (gastroesophageal reflux disease)   . Diabetes mellitus without complication     TYPE 2  . Cervical radiculopathy   . Lytic bone lesions on xray   . Multiple myeloma     multiple myeloma  . Headache     migraines    Past Surgical History  Procedure Laterality Date  . Left arm pinning      fracture arm  . Pins removed      Rigth arm  . Rotator cuff repair      right  . Lumbar fusion  05/04/2012  . Back surgery    . Cervical fusion  July 2014  . Bone marrow biopsy  03/22/2014  . Vasectomy    . Eye surgery Right     eye injury ( removed nail)    Family History  Problem Relation Age of Onset  . Congestive Heart Failure Mother   . Panic disorder Mother   . Diabetes Father   . Congestive Heart Failure Father    Social History:  reports that he quit smoking about 7 years ago. His smoking use included Cigarettes. He has a 52 pack-year smoking history. He has never used smokeless tobacco. He reports that he does not drink alcohol or use illicit drugs.  Allergies:  Allergies  Allergen Reactions  . Aleve [Naproxen Sodium] Anaphylaxis  . Zofran [Ondansetron Hcl] Other (See Comments)    Patient gets a headache that feels like a migraine    No prescriptions prior to admission    No results found for this or any previous visit (from the past 48 hour(s)). No results found.  Review of Systems  Constitutional: Positive for malaise/fatigue. Negative for fever, chills, weight loss and diaphoresis.  Eyes: Negative.   Respiratory: Negative.  Negative for cough.   Cardiovascular: Negative.  Negative for chest pain.  Gastrointestinal: Positive for heartburn. Negative for nausea, vomiting, abdominal  pain, diarrhea, constipation, blood in stool and melena.  Musculoskeletal: Positive for back pain, joint pain and neck pain. Negative for myalgias and falls.  Skin: Negative.   Neurological: Negative.  Negative for weakness.  Endo/Heme/Allergies: Negative for polydipsia. Does not bruise/bleed easily.  Psychiatric/Behavioral: Negative.     There were no vitals taken for this visit. Physical Exam  Constitutional: He is oriented to person, place, and time. He appears well-developed and well-nourished.  HENT:  Head: Normocephalic and atraumatic.  Eyes: EOM are normal. Pupils are equal, round, and reactive to light.  Neck: Normal range of motion. Neck supple.  Cardiovascular: Normal rate, regular rhythm, normal heart sounds and intact distal pulses.   Respiratory: Effort normal and breath sounds normal.  GI: Soft. Bowel sounds are normal.  Genitourinary:  deferred  Musculoskeletal:  Pain with ROM L hip  Neurological: He is alert and oriented to person, place, and time.  Skin: Skin is warm and dry.  Psychiatric: He has a normal mood and affect. His behavior is normal. Judgment and thought content normal.     Assessment/Plan Impending pathologic fracture left femoral neck  Plan for prophylactic IM nail left femur followed by postop XRT in 2-3 weeks.   Tal Neer, Horald Pollen 07/26/2014, 7:20 PM

## 2014-07-27 ENCOUNTER — Telehealth: Payer: Self-pay | Admitting: *Deleted

## 2014-07-27 ENCOUNTER — Ambulatory Visit (HOSPITAL_COMMUNITY): Payer: BC Managed Care – PPO | Admitting: Certified Registered"

## 2014-07-27 ENCOUNTER — Observation Stay (HOSPITAL_COMMUNITY)
Admission: RE | Admit: 2014-07-27 | Discharge: 2014-07-28 | Disposition: A | Discharge status: Home / Self Care | Payer: BC Managed Care – PPO | Source: Ambulatory Visit | Attending: Orthopedic Surgery | Admitting: Orthopedic Surgery

## 2014-07-27 ENCOUNTER — Observation Stay (HOSPITAL_COMMUNITY): Payer: BC Managed Care – PPO

## 2014-07-27 ENCOUNTER — Encounter (HOSPITAL_COMMUNITY): Payer: Self-pay | Admitting: *Deleted

## 2014-07-27 ENCOUNTER — Encounter (HOSPITAL_COMMUNITY)
Admission: RE | Disposition: A | Discharge status: Home / Self Care | Payer: Self-pay | Source: Ambulatory Visit | Attending: Orthopedic Surgery

## 2014-07-27 ENCOUNTER — Ambulatory Visit (HOSPITAL_COMMUNITY): Payer: BC Managed Care – PPO

## 2014-07-27 DIAGNOSIS — M25552 Pain in left hip: Secondary | ICD-10-CM | POA: Diagnosis present

## 2014-07-27 DIAGNOSIS — K219 Gastro-esophageal reflux disease without esophagitis: Secondary | ICD-10-CM | POA: Diagnosis not present

## 2014-07-27 DIAGNOSIS — E119 Type 2 diabetes mellitus without complications: Secondary | ICD-10-CM | POA: Insufficient documentation

## 2014-07-27 DIAGNOSIS — Z09 Encounter for follow-up examination after completed treatment for conditions other than malignant neoplasm: Secondary | ICD-10-CM

## 2014-07-27 DIAGNOSIS — Z419 Encounter for procedure for purposes other than remedying health state, unspecified: Secondary | ICD-10-CM

## 2014-07-27 DIAGNOSIS — C9000 Multiple myeloma not having achieved remission: Secondary | ICD-10-CM | POA: Diagnosis not present

## 2014-07-27 DIAGNOSIS — Z87891 Personal history of nicotine dependence: Secondary | ICD-10-CM | POA: Diagnosis not present

## 2014-07-27 DIAGNOSIS — M5412 Radiculopathy, cervical region: Secondary | ICD-10-CM | POA: Insufficient documentation

## 2014-07-27 HISTORY — PX: FEMUR IM NAIL: SHX1597

## 2014-07-27 HISTORY — DX: Headache, unspecified: R51.9

## 2014-07-27 HISTORY — DX: Headache: R51

## 2014-07-27 LAB — CBC
HCT: 41.7 % (ref 39.0–52.0)
HEMOGLOBIN: 14 g/dL (ref 13.0–17.0)
MCH: 28.1 pg (ref 26.0–34.0)
MCHC: 33.6 g/dL (ref 30.0–36.0)
MCV: 83.7 fL (ref 78.0–100.0)
Platelets: 178 10*3/uL (ref 150–400)
RBC: 4.98 MIL/uL (ref 4.22–5.81)
RDW: 14.8 % (ref 11.5–15.5)
WBC: 4.4 10*3/uL (ref 4.0–10.5)

## 2014-07-27 LAB — GLUCOSE, CAPILLARY
GLUCOSE-CAPILLARY: 166 mg/dL — AB (ref 70–99)
GLUCOSE-CAPILLARY: 200 mg/dL — AB (ref 70–99)
Glucose-Capillary: 316 mg/dL — ABNORMAL HIGH (ref 70–99)
Glucose-Capillary: 283 mg/dL — ABNORMAL HIGH (ref 70–99)

## 2014-07-27 LAB — BASIC METABOLIC PANEL
ANION GAP: 10 (ref 5–15)
BUN: 12 mg/dL (ref 6–23)
CALCIUM: 9.2 mg/dL (ref 8.4–10.5)
CHLORIDE: 101 mmol/L (ref 96–112)
CO2: 23 mmol/L (ref 19–32)
CREATININE: 0.73 mg/dL (ref 0.50–1.35)
GFR calc non Af Amer: >90 mL/min (ref >90)
Glucose, Bld: 167 mg/dL — ABNORMAL HIGH (ref 70–99)
Potassium: 4.4 mmol/L (ref 3.5–5.1)
SODIUM: 134 mmol/L — AB (ref 135–145)

## 2014-07-27 LAB — TYPE AND SCREEN
ABO/RH(D): B POS
Antibody Screen: NEGATIVE

## 2014-07-27 SURGERY — INSERTION, INTRAMEDULLARY ROD, FEMUR
Anesthesia: General | Site: Leg Upper | Laterality: Left

## 2014-07-27 MED ORDER — METOCLOPRAMIDE HCL 10 MG PO TABS: 5.0000 mg | ORAL_TABLET | Freq: Three times a day (TID) | ORAL | Status: DC | PRN

## 2014-07-27 MED ORDER — PROPOFOL 10 MG/ML IV BOLUS
INTRAVENOUS | Status: DC | PRN
  Administered 2014-07-27: 200 mg via INTRAVENOUS

## 2014-07-27 MED ORDER — PHENOL 1.4 % MT LIQD
1.0000 | OROMUCOSAL | Status: DC | PRN
Start: 2014-07-27 — End: 2014-07-28

## 2014-07-27 MED ORDER — CHLORHEXIDINE GLUCONATE 4 % EX LIQD
60.0000 mL | Freq: Once | CUTANEOUS | Status: DC
  Filled 2014-07-27: qty 60

## 2014-07-27 MED ORDER — FENTANYL CITRATE 0.05 MG/ML IJ SOLN
INTRAMUSCULAR | Status: AC
  Filled 2014-07-27: qty 5

## 2014-07-27 MED ORDER — ACETAMINOPHEN 10 MG/ML IV SOLN
1000.0000 mg | Freq: Once | INTRAVENOUS | Status: AC
  Administered 2014-07-27: 1000 mg via INTRAVENOUS
  Filled 2014-07-27: qty 100

## 2014-07-27 MED ORDER — METFORMIN HCL 500 MG PO TABS
1000.0000 mg | ORAL_TABLET | Freq: Two times a day (BID) | ORAL | Status: DC
  Administered 2014-07-28: 1000 mg via ORAL
  Filled 2014-07-27 (×2): qty 2

## 2014-07-27 MED ORDER — ACETAMINOPHEN 325 MG PO TABS: 650.0000 mg | ORAL_TABLET | Freq: Four times a day (QID) | ORAL | Status: DC | PRN

## 2014-07-27 MED ORDER — HYDROMORPHONE HCL 1 MG/ML IJ SOLN
0.2500 mg | INTRAMUSCULAR | Status: DC | PRN
  Administered 2014-07-27 (×2): 0.5 mg via INTRAVENOUS

## 2014-07-27 MED ORDER — LACTATED RINGERS IV SOLN
INTRAVENOUS | Status: DC
  Administered 2014-07-27: 09:00:00 via INTRAVENOUS

## 2014-07-27 MED ORDER — GLYCOPYRROLATE 0.2 MG/ML IJ SOLN
INTRAMUSCULAR | Status: AC
  Filled 2014-07-27: qty 3

## 2014-07-27 MED ORDER — ONDANSETRON HCL 4 MG/2ML IJ SOLN
INTRAMUSCULAR | Status: DC | PRN
  Administered 2014-07-27: 4 mg via INTRAVENOUS

## 2014-07-27 MED ORDER — GABAPENTIN 600 MG PO TABS
600.0000 mg | ORAL_TABLET | Freq: Three times a day (TID) | ORAL | Status: DC
  Administered 2014-07-27 – 2014-07-28 (×2): 600 mg via ORAL
  Filled 2014-07-27 (×5): qty 1

## 2014-07-27 MED ORDER — SODIUM CHLORIDE 0.9 % IV SOLN: INTRAVENOUS | Status: DC

## 2014-07-27 MED ORDER — 0.9 % SODIUM CHLORIDE (POUR BTL) OPTIME
TOPICAL | Status: DC | PRN
  Administered 2014-07-27: 1000 mL

## 2014-07-27 MED ORDER — GLYCOPYRROLATE 0.2 MG/ML IJ SOLN
INTRAMUSCULAR | Status: DC | PRN
  Administered 2014-07-27: .6 mg via INTRAVENOUS

## 2014-07-27 MED ORDER — SENNA 8.6 MG PO TABS
1.0000 | ORAL_TABLET | Freq: Two times a day (BID) | ORAL | Status: DC
Start: 2014-07-27 — End: 2014-07-28
  Administered 2014-07-27 – 2014-07-28 (×2): 8.6 mg via ORAL
  Filled 2014-07-27 (×2): qty 1

## 2014-07-27 MED ORDER — PROPOFOL 10 MG/ML IV BOLUS
INTRAVENOUS | Status: AC
  Filled 2014-07-27: qty 20

## 2014-07-27 MED ORDER — METOCLOPRAMIDE HCL 5 MG/ML IJ SOLN: 5.0000 mg | Freq: Three times a day (TID) | INTRAMUSCULAR | Status: DC | PRN

## 2014-07-27 MED ORDER — HYDROCODONE-ACETAMINOPHEN 5-325 MG PO TABS
1.0000 | ORAL_TABLET | Freq: Four times a day (QID) | ORAL | Status: DC | PRN
  Administered 2014-07-27 – 2014-07-28 (×3): 2 via ORAL
  Filled 2014-07-27 (×3): qty 2

## 2014-07-27 MED ORDER — NEOSTIGMINE METHYLSULFATE 10 MG/10ML IV SOLN
INTRAVENOUS | Status: AC
  Filled 2014-07-27: qty 1

## 2014-07-27 MED ORDER — CEFAZOLIN SODIUM-DEXTROSE 2-3 GM-% IV SOLR
2.0000 g | INTRAVENOUS | Status: AC
  Administered 2014-07-27: 2 g via INTRAVENOUS
  Filled 2014-07-27: qty 50

## 2014-07-27 MED ORDER — MORPHINE SULFATE 2 MG/ML IJ SOLN
0.5000 mg | INTRAMUSCULAR | Status: DC | PRN
  Administered 2014-07-27 (×2): 0.5 mg via INTRAVENOUS
  Filled 2014-07-27 (×2): qty 1

## 2014-07-27 MED ORDER — LORAZEPAM 0.5 MG PO TABS: 0.5000 mg | ORAL_TABLET | Freq: Four times a day (QID) | ORAL | Status: DC | PRN

## 2014-07-27 MED ORDER — CEFAZOLIN SODIUM-DEXTROSE 2-3 GM-% IV SOLR
2.0000 g | Freq: Four times a day (QID) | INTRAVENOUS | Status: AC
  Administered 2014-07-27: 2 g via INTRAVENOUS
  Filled 2014-07-27 (×3): qty 50

## 2014-07-27 MED ORDER — ROCURONIUM BROMIDE 50 MG/5ML IV SOLN
INTRAVENOUS | Status: AC
  Filled 2014-07-27: qty 1

## 2014-07-27 MED ORDER — MEPERIDINE HCL 25 MG/ML IJ SOLN: 6.2500 mg | INTRAMUSCULAR | Status: DC | PRN

## 2014-07-27 MED ORDER — DOCUSATE SODIUM 100 MG PO CAPS
100.0000 mg | ORAL_CAPSULE | Freq: Two times a day (BID) | ORAL | Status: DC
  Administered 2014-07-27 – 2014-07-28 (×2): 100 mg via ORAL
  Filled 2014-07-27 (×2): qty 1

## 2014-07-27 MED ORDER — LACTATED RINGERS IV SOLN
INTRAVENOUS | Status: DC | PRN
  Administered 2014-07-27 (×2): via INTRAVENOUS

## 2014-07-27 MED ORDER — PROCHLORPERAZINE MALEATE 10 MG PO TABS
10.0000 mg | ORAL_TABLET | Freq: Four times a day (QID) | ORAL | Status: DC | PRN
  Filled 2014-07-27: qty 1

## 2014-07-27 MED ORDER — PROMETHAZINE HCL 25 MG/ML IJ SOLN: 6.2500 mg | INTRAMUSCULAR | Status: DC | PRN

## 2014-07-27 MED ORDER — HYDROMORPHONE HCL 1 MG/ML IJ SOLN
INTRAMUSCULAR | Status: AC
  Filled 2014-07-27: qty 1

## 2014-07-27 MED ORDER — DEXAMETHASONE SODIUM PHOSPHATE 4 MG/ML IJ SOLN
INTRAMUSCULAR | Status: DC | PRN
  Administered 2014-07-27: 4 mg via INTRAVENOUS

## 2014-07-27 MED ORDER — INSULIN ASPART 100 UNIT/ML ~~LOC~~ SOLN
0.0000 [IU] | Freq: Three times a day (TID) | SUBCUTANEOUS | Status: DC
  Administered 2014-07-28: 5 [IU] via SUBCUTANEOUS
  Administered 2014-07-28: 3 [IU] via SUBCUTANEOUS

## 2014-07-27 MED ORDER — ACYCLOVIR 400 MG PO TABS
400.0000 mg | ORAL_TABLET | Freq: Two times a day (BID) | ORAL | Status: DC
  Administered 2014-07-27 – 2014-07-28 (×2): 400 mg via ORAL
  Filled 2014-07-27 (×2): qty 1

## 2014-07-27 MED ORDER — FENTANYL 25 MCG/HR TD PT72
25.0000 ug | MEDICATED_PATCH | TRANSDERMAL | Status: DC
  Administered 2014-07-27: 25 ug via TRANSDERMAL
  Filled 2014-07-27: qty 1

## 2014-07-27 MED ORDER — FENTANYL CITRATE 0.05 MG/ML IJ SOLN
INTRAMUSCULAR | Status: DC | PRN
  Administered 2014-07-27 (×3): 50 ug via INTRAVENOUS
  Administered 2014-07-27: 75 ug via INTRAVENOUS
  Administered 2014-07-27: 100 ug via INTRAVENOUS
  Administered 2014-07-27: 50 ug via INTRAVENOUS
  Administered 2014-07-27: 25 ug via INTRAVENOUS
  Administered 2014-07-27 (×2): 50 ug via INTRAVENOUS

## 2014-07-27 MED ORDER — NEOSTIGMINE METHYLSULFATE 10 MG/10ML IV SOLN
INTRAVENOUS | Status: DC | PRN
  Administered 2014-07-27: 4 mg via INTRAVENOUS

## 2014-07-27 MED ORDER — DEXAMETHASONE SODIUM PHOSPHATE 4 MG/ML IJ SOLN
4.0000 mg | Freq: Once | INTRAMUSCULAR | Status: DC
  Filled 2014-07-27: qty 1

## 2014-07-27 MED ORDER — LIDOCAINE HCL (CARDIAC) 20 MG/ML IV SOLN
INTRAVENOUS | Status: DC | PRN
  Administered 2014-07-27: 50 mg via INTRAVENOUS

## 2014-07-27 MED ORDER — LIDOCAINE HCL (CARDIAC) 20 MG/ML IV SOLN
INTRAVENOUS | Status: AC
  Filled 2014-07-27: qty 5

## 2014-07-27 MED ORDER — MENTHOL 3 MG MT LOZG: 1.0000 | LOZENGE | OROMUCOSAL | Status: DC | PRN

## 2014-07-27 MED ORDER — ROCURONIUM BROMIDE 100 MG/10ML IV SOLN
INTRAVENOUS | Status: DC | PRN
  Administered 2014-07-27: 40 mg via INTRAVENOUS
  Administered 2014-07-27 (×2): 10 mg via INTRAVENOUS

## 2014-07-27 MED ORDER — FAMOTIDINE 20 MG PO TABS
20.0000 mg | ORAL_TABLET | Freq: Two times a day (BID) | ORAL | Status: DC
  Administered 2014-07-27 – 2014-07-28 (×2): 20 mg via ORAL
  Filled 2014-07-27 (×2): qty 1

## 2014-07-27 MED ORDER — LACTATED RINGERS IV SOLN
INTRAVENOUS | Status: DC
  Administered 2014-07-27: 23:00:00 via INTRAVENOUS

## 2014-07-27 MED ORDER — ACETAMINOPHEN 650 MG RE SUPP: 650.0000 mg | Freq: Four times a day (QID) | RECTAL | Status: DC | PRN

## 2014-07-27 SURGICAL SUPPLY — 42 items
ALCOHOL ISOPROPYL (RUBBING) (MISCELLANEOUS) ×3 IMPLANT
BIT DRILL 4.3MMS DISTAL GRDTED (BIT) ×1 IMPLANT
CHLORAPREP W/TINT 26ML (MISCELLANEOUS) ×3 IMPLANT
COVER PERINEAL POST (MISCELLANEOUS) ×3 IMPLANT
COVER SURGICAL LIGHT HANDLE (MISCELLANEOUS) ×3 IMPLANT
DERMABOND ADVANCED (GAUZE/BANDAGES/DRESSINGS) ×2
DERMABOND ADVANCED .7 DNX12 (GAUZE/BANDAGES/DRESSINGS) ×1 IMPLANT
DRAPE C-ARM 42X72 X-RAY (DRAPES) ×3 IMPLANT
DRAPE C-ARMOR (DRAPES) ×3 IMPLANT
DRAPE IMP U-DRAPE 54X76 (DRAPES) ×6 IMPLANT
DRAPE UNIVERSAL PACK (DRAPES) ×3 IMPLANT
DRILL 4.3MMS DISTAL GRADUATED (BIT) ×3
DRSG MEPILEX BORDER 4X4 (GAUZE/BANDAGES/DRESSINGS) ×3 IMPLANT
ELECT REM PT RETURN 9FT ADLT (ELECTROSURGICAL) ×3
ELECTRODE REM PT RTRN 9FT ADLT (ELECTROSURGICAL) ×1 IMPLANT
GLOVE BIOGEL PI IND STRL 8.5 (GLOVE) ×2 IMPLANT
GLOVE BIOGEL PI INDICATOR 8.5 (GLOVE) ×4
GLOVE SURG ORTHO 8.5 STRL (GLOVE) ×3 IMPLANT
GOWN STRL REUS W/ TWL LRG LVL3 (GOWN DISPOSABLE) ×2 IMPLANT
GOWN STRL REUS W/TWL 2XL LVL3 (GOWN DISPOSABLE) ×3 IMPLANT
GOWN STRL REUS W/TWL LRG LVL3 (GOWN DISPOSABLE) ×4
GUIDEWIRE BALL NOSE 100CM (WIRE) ×3 IMPLANT
HIP FRAC NAIL LAG SCR 10.5X100 (Orthopedic Implant) ×2 IMPLANT
KIT ROOM TURNOVER OR (KITS) ×3 IMPLANT
LINER BOOT UNIVERSAL DISP (MISCELLANEOUS) ×3 IMPLANT
LIQUID BAND (GAUZE/BANDAGES/DRESSINGS) ×3 IMPLANT
MANIFOLD NEPTUNE II (INSTRUMENTS) ×3 IMPLANT
NAIL HIP FRACT LT 130D 11X400 (Nail) ×3 IMPLANT
NS IRRIG 1000ML POUR BTL (IV SOLUTION) ×3 IMPLANT
PACK GENERAL/GYN (CUSTOM PROCEDURE TRAY) ×3 IMPLANT
PAD ARMBOARD 7.5X6 YLW CONV (MISCELLANEOUS) ×6 IMPLANT
SCREW BONE CORTICAL 5.0X40 (Screw) ×3 IMPLANT
SCREW CANN THRD AFF 10.5X100 (Orthopedic Implant) ×1 IMPLANT
SUT MNCRL AB 3-0 PS2 18 (SUTURE) ×3 IMPLANT
SUT MNCRL AB 3-0 PS2 27 (SUTURE) ×3 IMPLANT
SUT MON AB 2-0 CT1 36 (SUTURE) ×3 IMPLANT
SUT VIC AB 0 CT1 27 (SUTURE) ×2
SUT VIC AB 0 CT1 27XBRD ANBCTR (SUTURE) ×1 IMPLANT
SUT VIC AB 2-0 CT1 27 (SUTURE) ×2
SUT VIC AB 2-0 CT1 TAPERPNT 27 (SUTURE) ×1 IMPLANT
TOWEL OR 17X24 6PK STRL BLUE (TOWEL DISPOSABLE) ×3 IMPLANT
TOWEL OR 17X26 10 PK STRL BLUE (TOWEL DISPOSABLE) ×3 IMPLANT

## 2014-07-27 NOTE — Progress Notes (Signed)
Orthopedic Tech Progress Note Patient Details:  Marwan Lipe Angelillo 08-Jun-1970 [id number] Patient refused ohf. Patient ID: Aldona Lento, male   DOB: 1970/09/29, 44 y.o.   MRN: [id number]   Braulio Bosch 07/27/2014, 7:14 PM

## 2014-07-27 NOTE — Op Note (Signed)
OPERATIVE REPORT   SURGEON: Elie Goody, MD.  PREOPERATIVE DIAGNOSIS: Impending pathologic fracture, left femoral neck.  POSTOPERATIVE DIAGNOSIS: Impending pathologic fracture, left femoral neck.  PROCEDURE: Prophylactic cephalomedullary nail placement, left femur.  ANESTHESIA: General.  IMPLANTS: 1. DePuy Affixus nail, 11 x 400 mm x 130 degrees. 2. 10.5 x 100 mm Titanium lag screw. 3. 5.0 mm distal interlocking screw x1.  ESTIMATED BLOOD LOSS: 100 mL.  DRAINS: None.  SPECIMENS: Left femoral neck lesion for permanent pathology.  COMPLICATIONS: None.  DISPOSITION: Stable to PACU.  ANTIBIOTICS: 2 g Ancef.  INDICATION: The patient is a 44year-old male, who presented to my office yesterday with left hip pain times 2 weeks. He was diagnosed with multiple myeloma in October 2015. His oncologist obtained xrays showing a large lytic lesion in the femoral neck. Risks,  benefits, alternatives of the above mentioned procedure were explained, and he elected to proceed.  PROCEDURE IN DETAIL: The patient was correctly identified in the preop holding area using 2 patient identifies. History and physical and consent were reviewed and found to be appropriate. The patient was taken to the operating room and general anesthesia was induced on the bed. He was then transferred to the [facility] table. The left hip was prepped and draped in the normal sterile surgical fashion. Time-out was called verifying sign and site of surgery. He did receive IV antibiotics within 60 minutes, began the procedure. I began by using fluoroscopy to find his anatomy. A 4 cm incision was made proximal the tip of the greater trochanter. Under fluoroscopic control, I inserted a guidepin at the standard site for a trochanteric entry nail. The wire was advanced to the level of lesser trochanter and then an entry reamer was used. I then placed the guidewire and position was  confirmed fluoroscopically down to the physeal scar in the knee. I then measured the length of the wire. I reamed sequentially up to an 13 mm reamer. The real nail was opened and placed over the guidewire. The guidewire was removed and through a separate stab incision, a cannula was placed for the cannulated screw. The screw was measured and drilled under fluoroscopic control. I used a long pituitary rongeur to obtain a sample  of the tissue in the lytic lesion. The lag screw was then placed. Multiple views were used to confirm tip apex distance, which was appropriate. There was no chondral penetration. The jig was removed. Final fluoroscopic views were obtained. I placed one distal interlocking screw using perfect circles.  The wounds were then copiously irrigated with saline. They were closed in layers with #1 Vicryl for the deep fascia, 2-0 Monocryl for the deep dermal layer, and 3-0 running Monocryl subcuticular stitch. Dermabond was applied to the skin. After the glue was fully hardened, a sterile dressing was applied. Sponge, needle, and instrument counts were correct at the end of the case x2. The patient was then transferred to his bed and extubated. He was taken to PACU in stable condition. There were no known complications.

## 2014-07-27 NOTE — Interval H&P Note (Signed)
History and Physical Interval Note:  07/27/2014 11:32 AM  Dylan Benson  has presented today for surgery, with the diagnosis of LEFT FEMORAL BONE LESION  The various methods of treatment have been discussed with the patient and family. After consideration of risks, benefits and other options for treatment, the patient has consented to  Procedure(s): INTRAMEDULLARY (IM) NAIL FEMORAL CEPHALOMEDULLARY NAIL LEFT FEMUR (Left) as a surgical intervention .  The patient's history has been reviewed, patient examined, no change in status, stable for surgery.  I have reviewed the patient's chart and labs.  Questions were answered to the patient's satisfaction.     Arnoldo Hildreth, Horald Pollen

## 2014-07-27 NOTE — Telephone Encounter (Signed)
I will touch base with radiation

## 2014-07-27 NOTE — Anesthesia Postprocedure Evaluation (Signed)
  Anesthesia Post-op Note  Patient: Dylan Benson  Procedure(s) Performed: Procedure(s): CEPHALOMEDULLARY NAIL LEFT FEMUR (Left)  Patient Location: PACU  Anesthesia Type:General  Level of Consciousness: awake, alert , oriented and patient cooperative  Airway and Oxygen Therapy: Patient Spontanous Breathing  Post-op Pain: mild  Post-op Assessment: Post-op Vital signs reviewed, Patient's Cardiovascular Status Stable, Respiratory Function Stable, Patent Airway, No signs of Nausea or vomiting and Pain level controlled  Post-op Vital Signs: stable  Last Vitals:  Filed Vitals:   07/27/14 1633  BP: 132/78  Pulse: 96  Temp: 36.9 C  Resp: 18    Complications: No apparent anesthesia complications

## 2014-07-27 NOTE — Transfer of Care (Signed)
Immediate Anesthesia Transfer of Care Note  Patient: Dylan Benson  Procedure(s) Performed: Procedure(s): CEPHALOMEDULLARY NAIL LEFT FEMUR (Left)  Patient Location: PACU  Anesthesia Type:General  Level of Consciousness: awake, alert  and oriented  Airway & Oxygen Therapy: Patient Spontanous Breathing and Patient connected to nasal cannula oxygen  Post-op Assessment: Report given to RN and Post -op Vital signs reviewed and stable  Post vital signs: Reviewed and stable  Last Vitals:  Filed Vitals:   07/27/14 0828  BP: 124/81  Pulse: 83  Temp: 36.4 C  Resp: 20    Complications: No apparent anesthesia complications

## 2014-07-27 NOTE — Telephone Encounter (Signed)
TWO TO THREE WEEKS AFTER SURGERY PT. WILL NEED RADIATION TO PROXIMAL LEFT FEMUR AND RIGHT ISCHIUM.

## 2014-07-27 NOTE — Anesthesia Preprocedure Evaluation (Addendum)
Anesthesia Evaluation  Patient identified by MRN, date of birth, ID band Patient awake    Reviewed: Allergy & Precautions, H&P , NPO status , Patient's Chart, lab work & pertinent test results  History of Anesthesia Complications Negative for: history of anesthetic complications  Airway Mallampati: III  TM Distance: >3 FB Neck ROM: Full    Dental  (+) Teeth Intact, Poor Dentition, Dental Advisory Given   Pulmonary neg pulmonary ROS, former smoker,  breath sounds clear to auscultation        Cardiovascular negative cardio ROS  Rhythm:Regular Rate:Normal     Neuro/Psych  Headaches, PSYCHIATRIC DISORDERS    GI/Hepatic Neg liver ROS, GERD-  Medicated and Controlled,  Endo/Other  diabetes, Well Controlled, Type 2, Oral Hypoglycemic Agents  Renal/GU Renal diseasenegative Renal ROS     Musculoskeletal negative musculoskeletal ROS (+)   Abdominal   Peds  Hematology negative hematology ROS (+)   Anesthesia Other Findings   Reproductive/Obstetrics                          Anesthesia Physical  Anesthesia Plan  ASA: III  Anesthesia Plan: General   Post-op Pain Management:    Induction: Intravenous  Airway Management Planned: Oral ETT and Video Laryngoscope Planned  Additional Equipment: None  Intra-op Plan:   Post-operative Plan: Extubation in OR  Informed Consent: I have reviewed the patients History and Physical, chart, labs and discussed the procedure including the risks, benefits and alternatives for the proposed anesthesia with the patient or authorized representative who has indicated his/her understanding and acceptance.   Dental advisory given  Plan Discussed with: CRNA  Anesthesia Plan Comments: (+/- glidescope)       Anesthesia Quick Evaluation

## 2014-07-28 ENCOUNTER — Encounter (HOSPITAL_COMMUNITY): Payer: Self-pay | Admitting: Orthopedic Surgery

## 2014-07-28 ENCOUNTER — Encounter: Payer: Self-pay | Admitting: *Deleted

## 2014-07-28 DIAGNOSIS — C9000 Multiple myeloma not having achieved remission: Secondary | ICD-10-CM | POA: Diagnosis not present

## 2014-07-28 LAB — CBC
HEMATOCRIT: 37.1 % — AB (ref 39.0–52.0)
Hemoglobin: 12.3 g/dL — ABNORMAL LOW (ref 13.0–17.0)
MCH: 27.6 pg (ref 26.0–34.0)
MCHC: 33.2 g/dL (ref 30.0–36.0)
MCV: 83.2 fL (ref 78.0–100.0)
PLATELETS: 183 10*3/uL (ref 150–400)
RBC: 4.46 MIL/uL (ref 4.22–5.81)
RDW: 14.8 % (ref 11.5–15.5)
WBC: 5.6 10*3/uL (ref 4.0–10.5)

## 2014-07-28 LAB — BASIC METABOLIC PANEL
Anion gap: 11 (ref 5–15)
BUN: 9 mg/dL (ref 6–23)
CHLORIDE: 99 mmol/L (ref 96–112)
CO2: 23 mmol/L (ref 19–32)
Calcium: 8.8 mg/dL (ref 8.4–10.5)
Creatinine, Ser: 0.78 mg/dL (ref 0.50–1.35)
GFR calc Af Amer: >90 mL/min (ref >90)
GFR calc non Af Amer: >90 mL/min (ref >90)
Glucose, Bld: 223 mg/dL — ABNORMAL HIGH (ref 70–99)
Potassium: 3.9 mmol/L (ref 3.5–5.1)
Sodium: 133 mmol/L — ABNORMAL LOW (ref 135–145)

## 2014-07-28 LAB — GLUCOSE, CAPILLARY
GLUCOSE-CAPILLARY: 221 mg/dL — AB (ref 70–99)
Glucose-Capillary: 180 mg/dL — ABNORMAL HIGH (ref 70–99)

## 2014-07-28 MED ORDER — RIVAROXABAN 10 MG PO TABS
10.0000 mg | ORAL_TABLET | Freq: Every day | ORAL | Status: DC
  Administered 2014-07-28: 10 mg via ORAL
  Filled 2014-07-28: qty 1

## 2014-07-28 MED ORDER — RIVAROXABAN 10 MG PO TABS: 10.0000 mg | ORAL_TABLET | Freq: Every day | ORAL | Status: DC

## 2014-07-28 MED ORDER — HYDROCODONE-ACETAMINOPHEN 5-325 MG PO TABS: 1.0000 | ORAL_TABLET | Freq: Four times a day (QID) | ORAL | Status: DC | PRN

## 2014-07-28 NOTE — Progress Notes (Signed)
Patient d/c to home, IV removed, prescriptions given, instructions reviewed. 

## 2014-07-28 NOTE — Evaluation (Signed)
Physical Therapy Evaluation Patient Details Name: Dylan Benson MRN: [id number] DOB: Mar 21, 1971 Today's Date: 07/28/2014   History of Present Illness  Patient is a 44 yo male admitted 07/27/14 with h/o metastatic multiple myeloma.  Patient with Lt hip pain. Xray showed large lytic lesion Lt femur.  Patient s/p prophylactic IM nail 07/27/14.  PMH:  metastatic multiple myeloma, DM, cervical radiculopathy, headaches.  Clinical Impression  Patient able to ambulate with RW and supervision, and negotiate stairs with min guard assist.  Wife to provide 24 hour assist.  No further acute PT needs - ready for d/c from PT perspective.    Follow Up Recommendations No PT follow up;Supervision/Assistance - 24 hour    Equipment Recommendations  None recommended by PT    Recommendations for Other Services       Precautions / Restrictions Precautions Precautions: None Restrictions Weight Bearing Restrictions: Yes LLE Weight Bearing: Weight bearing as tolerated      Mobility  Bed Mobility Overal bed mobility: Needs Assistance Bed Mobility: Supine to Sit     Supine to sit: Min assist     General bed mobility comments: Assist to move LLE off of bed.  Instructed wife on correct technique to assist patient.  Transfers Overall transfer level: Needs assistance Equipment used: Rolling walker (2 wheeled) Transfers: Sit to/from Stand Sit to Stand: Supervision         General transfer comment: Patient using correct hand placement.  Ambulation/Gait Ambulation/Gait assistance: Supervision Ambulation Distance (Feet): 62 Feet Assistive device: Rolling walker (2 wheeled) Gait Pattern/deviations: Step-to pattern;Decreased stance time - left;Decreased step length - right;Decreased weight shift to left;Antalgic Gait velocity: Decreased Gait velocity interpretation: Below normal speed for age/gender General Gait Details: Verbal cues for use of RW and gait sequence.  Supervision for safety  only.  Stairs Stairs: Yes Stairs assistance: Min guard Stair Management: Two rails;Step to pattern;Forwards;One rail Left;Sideways Number of Stairs: 3 General stair comments: Instructed patient to negotiate stairs with 2 rails forward and 1 rail sideways using step-to pattern.  Instructed wife on safe guarding technique.  Patient able to complete with min guard assist.  Wheelchair Mobility    Modified Rankin (Stroke Patients Only)       Balance                                             Pertinent Vitals/Pain Pain Assessment: 0-10 Pain Score: 5  Pain Location: Lt hip Pain Descriptors / Indicators: Aching Pain Intervention(s): Limited activity within patient's tolerance;Repositioned    Home Living Family/patient expects to be discharged to:: Private residence Living Arrangements: Spouse/significant other Available Help at Discharge: Family;Available 24 hours/day Type of Home: House Home Access: Stairs to enter Entrance Stairs-Rails: Right;Left;Can reach both Entrance Stairs-Number of Steps: 4 Home Layout: One level Home Equipment: Walker - 2 wheels;Bedside commode      Prior Function Level of Independence: Independent               Hand Dominance        Extremity/Trunk Assessment   Upper Extremity Assessment: Overall WFL for tasks assessed           Lower Extremity Assessment: LLE deficits/detail   LLE Deficits / Details: Decreased strength and ROM post-op  Cervical / Trunk Assessment: Normal  Communication   Communication: No difficulties  Cognition Arousal/Alertness: Awake/alert Behavior During Therapy: WFL for tasks  assessed/performed Overall Cognitive Status: Within Functional Limits for tasks assessed                      General Comments      Exercises        Assessment/Plan    PT Assessment Patent does not need any further PT services  PT Diagnosis Difficulty walking;Acute pain   PT Problem List     PT Treatment Interventions     PT Goals (Current goals can be found in the Care Plan section) Acute Rehab PT Goals PT Goal Formulation: All assessment and education complete, DC therapy    Frequency     Barriers to discharge        Co-evaluation               End of Session Equipment Utilized During Treatment: Gait belt Activity Tolerance: Patient tolerated treatment well Patient left: in chair;with call bell/phone within reach;with family/visitor present Nurse Communication: Mobility status (Ready for d/c from PT perspective)    Functional Assessment Tool Used: Clinical judgement Functional Limitation: Mobility: Walking and moving around Mobility: Walking and Moving Around Current Status (H4388): At least 1 percent but less than 20 percent impaired, limited or restricted Mobility: Walking and Moving Around Goal Status [phone]): At least 1 percent but less than 20 percent impaired, limited or restricted Mobility: Walking and Moving Around Discharge Status [phone]): At least 1 percent but less than 20 percent impaired, limited or restricted    Time: 6015-6153 PT Time Calculation (min) (ACUTE ONLY): 24 min   Charges:   PT Evaluation $Initial PT Evaluation Tier I: 1 Procedure PT Treatments $Gait Training: 8-22 mins   PT G Codes:   PT G-Codes **NOT FOR INPATIENT CLASS** Functional Assessment Tool Used: Clinical judgement Functional Limitation: Mobility: Walking and moving around Mobility: Walking and Moving Around Current Status (P9432): At least 1 percent but less than 20 percent impaired, limited or restricted Mobility: Walking and Moving Around Goal Status [phone]): At least 1 percent but less than 20 percent impaired, limited or restricted Mobility: Walking and Moving Around Discharge Status [phone]): At least 1 percent but less than 20 percent impaired, limited or restricted    Despina Pole 07/28/2014, 12:02 PM Carita Pian. Sanjuana Kava, [place] Pager  [phone]

## 2014-07-28 NOTE — Progress Notes (Signed)
UR completed 

## 2014-07-28 NOTE — Progress Notes (Signed)
   Subjective:  Patient reports pain as mild.  Denies N/V/CP/SOB.  Objective:   VITALS:   Filed Vitals:   07/28/14 0000 07/28/14 0204 07/28/14 0400 07/28/14 0600  BP:  98/57  130/65  Pulse:  91  86  Temp:  98.5 F (36.9 C)  98.3 F (36.8 C)  TempSrc:  Oral  Oral  Resp: _0 Height:      Weight:      SpO2: 96% 96% 96% 96%    ABD soft Sensation intact distally Intact pulses distally Dorsiflexion/Plantar flexion intact Incision: dressing C/D/I   Lab Results  Component Value Date   WBC 5.6 07/28/2014   HGB 12.3* 07/28/2014   HCT 37.1* 07/28/2014   MCV 83.2 07/28/2014   PLT 183 07/28/2014   BMET    Component Value Date/Time   NA 133* 07/28/2014 0500   NA 136 06/26/2014 1419   K 3.9 07/28/2014 0500   K 3.7 06/26/2014 1419   CL 99 07/28/2014 0500   CO2 23 07/28/2014 0500   CO2 23 06/26/2014 1419   GLUCOSE 223* 07/28/2014 0500   GLUCOSE 154* 06/26/2014 1419   BUN 9 07/28/2014 0500   BUN 9.4 06/26/2014 1419   CREATININE 0.78 07/28/2014 0500   CREATININE 0.7 06/26/2014 1419   CALCIUM 8.8 07/28/2014 0500   CALCIUM 9.3 06/26/2014 1419   GFRNONAA >90 07/28/2014 0500   GFRAA >90 07/28/2014 0500     Assessment/Plan: 1 Day Post-Op   Active Problems:   Metastatic multiple myeloma to bone   WBAT with walker xarelto Advance diet Up with therapy Discharge home with home health    Nakaya Mishkin, Horald Pollen 07/28/2014, 10:04 AM   Rod Can, MD Cell [phone]

## 2014-07-28 NOTE — Evaluation (Signed)
Occupational Therapy Evaluation and Discharge Patient Details Name: Dylan Benson MRN: [id number] DOB: Feb 04, 1971 Today's Date: 07/28/2014    History of Present Illness Patient is a 44 yo male admitted 07/27/14 with h/o metastatic multiple myeloma.  Patient with Lt hip pain. Xray showed large lytic lesion Lt femur.  Patient s/p prophylactic IM nail 07/27/14.  PMH:  metastatic multiple myeloma, DM, cervical radiculopathy, headaches.   Clinical Impression   PTA pt lived at home and was independent with ADLs. Pt currently requires supervision for functional mobility and assistance for LB ADLs. Pt will have wife's assistance at d/c. No further acute OT needs.      Follow Up Recommendations  No OT follow up;Supervision/Assistance - 24 hour    Equipment Recommendations  None recommended by OT    Recommendations for Other Services       Precautions / Restrictions Precautions Precautions: None Restrictions Weight Bearing Restrictions: Yes LLE Weight Bearing: Weight bearing as tolerated      Mobility Bed Mobility Overal bed mobility: Needs Assistance Bed Mobility: Supine to Sit     Supine to sit: Min assist     General bed mobility comments: Pt sitting in w/c when OT arrived.   Transfers Overall transfer level: Needs assistance Equipment used: Rolling walker (2 wheeled) Transfers: Sit to/from Stand Sit to Stand: Supervision         General transfer comment: Supervision for safety.          ADL Overall ADL's : Needs assistance/impaired Eating/Feeding: Independent;Sitting   Grooming: Supervision/safety;Standing   Upper Body Bathing: Set up;Sitting   Lower Body Bathing: Minimal assistance;Sit to/from stand   Upper Body Dressing : Set up;Sitting   Lower Body Dressing: Minimal assistance;Sit to/from stand   Toilet Transfer: Supervision/safety;Ambulation;RW;BSC   Toileting- Water quality scientist and Hygiene: Supervision/safety;Sit to/from Educational psychologist Details (indicate cue type and reason): demonstrated and educated pt/wife on safe tub transfer with use of 3N1 and RW Functional mobility during ADLs: Supervision/safety;Rolling walker General ADL Comments: Pt is eager to d/c home and will have assistance from his wife at d/c.                Pertinent Vitals/Pain Pain Assessment: 0-10 Pain Score: 5  Pain Location: Lt hip Pain Descriptors / Indicators: Aching Pain Intervention(s): Limited activity within patient's tolerance;Monitored during session     Hand Dominance Right   Extremity/Trunk Assessment Upper Extremity Assessment Upper Extremity Assessment: Overall WFL for tasks assessed   Lower Extremity Assessment Lower Extremity Assessment: Defer to PT evaluation LLE Deficits / Details: Decreased strength and ROM post-op LLE: Unable to fully assess due to pain   Cervical / Trunk Assessment Cervical / Trunk Assessment: Normal   Communication Communication Communication: No difficulties   Cognition Arousal/Alertness: Awake/alert Behavior During Therapy: WFL for tasks assessed/performed Overall Cognitive Status: Within Functional Limits for tasks assessed                                Home Living Family/patient expects to be discharged to:: Private residence Living Arrangements: Spouse/significant other Available Help at Discharge: Family;Available 24 hours/day Type of Home: House Home Access: Stairs to enter [employer] of Steps: 4 Entrance Stairs-Rails: Right;Left;Can reach both Home Layout: One level     Bathroom Shower/Tub: Teacher, early years/pre: Standard     Home Equipment: Environmental consultant - 2 wheels;Bedside commode  Prior Functioning/Environment Level of Independence: Independent             OT Diagnosis: Generalized weakness;Acute pain    End of Session Equipment Utilized During Treatment: Rolling walker  Activity Tolerance: Patient tolerated  treatment well Patient left: Other (comment) (assisted pt downstairs for d/c with wife)   Time: 6431-4276 OT Time Calculation (min): 11 min Charges:  OT General Charges $OT Visit: 1 Procedure OT Evaluation $Initial OT Evaluation Tier I: 1 Procedure G-Codes: OT G-codes **NOT FOR INPATIENT CLASS** Functional Assessment Tool Used: clinical judgement Functional Limitation: Self care Self Care Current Status (R0110): At least 1 percent but less than 20 percent impaired, limited or restricted Self Care Goal Status (Y3496): At least 1 percent but less than 20 percent impaired, limited or restricted Self Care Discharge Status [phone]): At least 1 percent but less than 20 percent impaired, limited or restricted  Juluis Rainier 07/28/2014, 1:07 PM  Cyndie Chime, OTR/L Occupational Therapist [phone] (pager)

## 2014-07-28 NOTE — Progress Notes (Signed)
[place] Psychosocial Distress Screening Clinical Social Work  Clinical Social Work was referred by distress screening protocol.  The patient scored a 7 on the Psychosocial Distress Thermometer which indicates moderate distress. Clinical Social Worker attempted to contact patient by phone to assess for distress and other psychosocial needs.   Dylan Benson only indicated pain as cause of distress and physician was notified.  CSW reviewed patient chart, patient discharged from hospital today for orthopedic surgery.  ON[facility]N DISTRESS SCREENING 07/12/2014  Screening Type Other (comment)  Distress experienced in past week (1-10) 7  Physical Problem type Pain  Physician notified of physical symptoms Yes    Clinical Social Worker follow up needed: Yes.    If yes, follow up plan:  CSW left voicemail requesting patient return call when convenient.   [name], MSW, LCSW, OSW-C Clinical Social Worker [facility] [phone]

## 2014-07-28 NOTE — Discharge Instructions (Signed)
Dr. Rod Can [facility] Specialist [facility] [address] [place], [place] [address] [phone]   Hip Rehabilitation, Guidelines Following Surgery  The results of a hip operation are greatly improved after range of motion and muscle strengthening exercises. Follow all safety measures which are given to protect your hip. If any of these exercises cause increased pain or swelling in your joint, decrease the amount until you are comfortable again. Then slowly increase the exercises. Call your caregiver if you have problems or questions.  HOME CARE INSTRUCTIONS  Remove items at home which could result in a fall. This includes throw rugs or furniture in walking pathways.  Continue medications as instructed at time of discharge.  You may have some home medications which will be placed on hold until you complete the course of blood thinner medication.  You may start showering once you are discharged home but do not submerge the incision under water. Just pat the incision dry and apply a dry gauze dressing on daily.  Walk with walker as instructed.  You may resume a sexual relationship in one month or when given the OK by your caregiver.  Use walker as long as suggested by your caregivers.  You may put full weight on your legs and walk as much as is comfortable. Avoid periods of inactivity such as sitting longer than an hour when not asleep. This helps prevent blood clots.  You may return to work once you are cleared by Engineer, production.  Do not drive a car for 6 weeks or until released by your surgeon.  Do not drive while taking narcotics.  Wear elastic stockings for three weeks following surgery during the day but you may remove then at night.  Make sure you keep all of your appointments after your operation with all of your doctors and caregivers. You should call the office at the above phone number and make an appointment for approximately two weeks after the  date of your surgery. Change the dressing daily and reapply a dry dressing each time. Please pick up a stool softener and laxative for home use as long as you are requiring pain medications.  ICE to the affected hip every three hours for 30 minutes at a time and then as needed for pain and swelling.  Continue to use ice on the hip for pain and swelling from surgery. You may notice swelling that will progress down to the foot and ankle.  This is normal after surgery.  Elevate the leg when you are not up walking on it.   It is important for you to complete the blood thinner medication as prescribed by your doctor.  Continue to use the breathing machine which will help keep your temperature down.  It is common for your temperature to cycle up and down following surgery, especially at night when you are not up moving around and exerting yourself.  The breathing machine keeps your lungs expanded and your temperature down.  RANGE OF MOTION AND STRENGTHENING EXERCISES  These exercises are designed to help you keep full movement of your hip joint. Follow your caregiver's or physical therapist's instructions. Perform all exercises about fifteen times, three times per day or as directed. Exercise both hips, even if you have had only one joint replacement. These exercises can be done on a training (exercise) mat, on the floor, on a table or on a bed. Use whatever works the best and is most comfortable for you. Use music or television  while you are exercising so that the exercises are a pleasant break in your day. This will make your life better with the exercises acting as a break in routine you can look forward to.  Lying on your back, slowly slide your foot toward your buttocks, raising your knee up off the floor. Then slowly slide your foot back down until your leg is straight again.  Lying on your back spread your legs as far apart as you can without causing discomfort.  Lying on your side, raise your upper  leg and foot straight up from the floor as far as is comfortable. Slowly lower the leg and repeat.  Lying on your back, tighten up the muscle in the front of your thigh (quadriceps muscles). You can do this by keeping your leg straight and trying to raise your heel off the floor. This helps strengthen the largest muscle supporting your knee.  Lying on your back, tighten up the muscles of your buttocks both with the legs straight and with the knee bent at a comfortable angle while keeping your heel on the floor.   SKILLED REHAB INSTRUCTIONS: If the patient is transferred to a skilled rehab facility following release from the hospital, a list of the current medications will be sent to the facility for the patient to continue.  When discharged from the skilled rehab facility, please have the facility set up the patient's [place] prior to being released. Also, the skilled facility will be responsible for providing the patient with their medications at time of release from the facility to include their pain medication, the muscle relaxants, and their blood thinner medication. If the patient is still at the rehab facility at time of the two week follow up appointment, the skilled rehab facility will also need to assist the patient in arranging follow up appointment in our office and any transportation needs.  MAKE SURE YOU:  Understand these instructions.  Will watch your condition.  Will get help right away if you are not doing well or get worse.  Pick up stool softner and laxative for home use following surgery while on pain medications. Do not submerge incision under water. Please use good hand washing techniques while changing dressing each day. May shower starting three days after surgery. Please use a clean towel to pat the incision dry following showers. Continue to use ice for pain and swelling after surgery. Do not use any lotions or creams on the incision until instructed  by your surgeon.

## 2014-07-28 NOTE — Discharge Summary (Signed)
Physician Discharge Summary  Patient ID: Dylan Benson MRN: [id number] DOB/AGE: 06-24-1970 44 y.o.  Admit date: 07/27/2014 Discharge date: 07/29/2014  Admission Diagnoses:  <principal problem not specified>  Discharge Diagnoses:  Active Problems:   Metastatic multiple myeloma to bone   Past Medical History  Diagnosis Date  . Kidney stone   . GERD (gastroesophageal reflux disease)   . Diabetes mellitus without complication     TYPE 2  . Cervical radiculopathy   . Lytic bone lesions on xray   . Multiple myeloma     multiple myeloma  . Headache     migraines    Surgeries: Procedure(s): CEPHALOMEDULLARY NAIL LEFT FEMUR on 07/27/2014   Consultants (if any):    Discharged Condition: Improved  Hospital Course: Dylan Benson is an 44 y.o. male who was admitted 07/27/2014 with a diagnosis of <principal problem not specified> and went to the operating room on 07/27/2014 and underwent the above named procedures.    He was given perioperative antibiotics:      Anti-infectives    Start     Dose/Rate Route Frequency Ordered Stop   07/27/14 2200  acyclovir (ZOVIRAX) tablet 400 mg  Status:  Discontinued     400 mg Oral 2 times daily 07/27/14 1743 07/28/14 1510   07/27/14 1745  ceFAZolin (ANCEF) IVPB 2 g/50 mL premix     2 g 100 mL/hr over 30 Minutes Intravenous Every 6 hours 07/27/14 1743 07/28/14 0544   07/27/14 0830  ceFAZolin (ANCEF) IVPB 2 g/50 mL premix     2 g 100 mL/hr over 30 Minutes Intravenous On call to O.R. 07/27/14 0818 07/27/14 1151    .  He was given sequential compression devices, early ambulation, and xarelto for DVT prophylaxis.  He benefited maximally from the hospital stay and there were no complications.    Recent vital signs:  Filed Vitals:   07/28/14 0600  BP: 130/65  Pulse: 86  Temp: 98.3 F (36.8 C)  Resp: 16    Recent laboratory studies:  Lab Results  Component Value Date   HGB 12.3* 07/28/2014   HGB 14.0 07/27/2014   HGB 13.8 07/09/2014    Lab Results  Component Value Date   WBC 5.6 07/28/2014   PLT 183 07/28/2014   Lab Results  Component Value Date   INR 0.93 03/22/2014   Lab Results  Component Value Date   NA 133* 07/28/2014   K 3.9 07/28/2014   CL 99 07/28/2014   CO2 23 07/28/2014   BUN 9 07/28/2014   CREATININE 0.78 07/28/2014   GLUCOSE 223* 07/28/2014    Discharge Medications:     Medication List    STOP taking these medications        aspirin 81 MG tablet     dexamethasone 4 MG tablet  Commonly known as:  DECADRON      TAKE these medications        acyclovir 400 MG tablet  Commonly known as:  ZOVIRAX  Take 1 tablet (400 mg total) by mouth 2 (two) times daily.     diazepam 5 MG tablet  Commonly known as:  VALIUM  Take 1 tablet (5 mg total) by mouth at bedtime.     fentaNYL 25 MCG/HR patch  Commonly known as:  DURAGESIC - dosed mcg/hr  Place 1 patch (25 mcg total) onto the skin every 3 (three) days.     gabapentin 600 MG tablet  Commonly known as:  NEURONTIN  Take 1 tablet (  600 mg total) by mouth 3 (three) times daily.     gabapentin 600 MG tablet  Commonly known as:  NEURONTIN  Take 1 tablet (600 mg total) by mouth 3 (three) times daily.     HYDROcodone-acetaminophen 5-325 MG per tablet  Commonly known as:  NORCO/VICODIN  Take 1-2 tablets by mouth every 6 (six) hours as needed for moderate pain.     HYDROmorphone 4 MG tablet  Commonly known as:  DILAUDID  Take 1 tablet (4 mg total) by mouth every 6 (six) hours as needed for severe pain.     insulin aspart protamine - aspart (70-30) 100 UNIT/ML FlexPen  Commonly known as:  NOVOLOG 70/30 MIX  Inject 15units before breakfast;  10 units before supper for DM Type 2     LORazepam 0.5 MG tablet  Commonly known as:  ATIVAN  Take 1 tablet (0.5 mg total) by mouth every 6 (six) hours as needed for anxiety.     LORazepam 0.5 MG tablet  Commonly known as:  ATIVAN  Take 1 tablet (0.5 mg total) by mouth every 6 (six) hours as needed for  anxiety.     metFORMIN 500 MG tablet  Commonly known as:  GLUCOPHAGE  Take 1,000 mg by mouth 2 (two) times daily with a meal.     prochlorperazine 10 MG tablet  Commonly known as:  COMPAZINE  Take 1 tablet (10 mg total) by mouth every 6 (six) hours as needed (Nausea or vomiting).     ranitidine 150 MG tablet  Commonly known as:  ZANTAC  Take 300 mg by mouth daily as needed for heartburn.     rivaroxaban 10 MG Tabs tablet  Commonly known as:  XARELTO  Take 1 tablet (10 mg total) by mouth daily with supper.        Diagnostic Studies: Dg Thoracic Spine 2 View  07/09/2014   CLINICAL DATA:  Thoracic back pain. Current history of multiple myeloma.  EXAM: THORACIC SPINE - 2 VIEW  COMPARISON:  MRI thoracic spine 03/01/2014.  FINDINGS: Twelve rib-bearing thoracic vertebrae with anatomic alignment. No acute fractures. Osseous demineralization. The numerous myeloma lesions throughout the thoracic spine identified on the prior MRI apart difficult to visualize on CT, though the lesion at T12 is vaguely visible. Prior C7 fusion appears solid.  IMPRESSION: 1. No acute osseous abnormality. 2. Osseous demineralization. Lytic lesion in the T12 vertebral body is vaguely visible. The multiple thoracic spine lesions identified on the prior MRI are difficult to visualize on the x-ray.   Electronically Signed   By: Evangeline Dakin M.D.   On: 07/09/2014 17:45   Dg Lumbar Spine 2-3 Views  07/09/2014   CLINICAL DATA:  Pain in the mid lower back. Edema on the left side. Previous history of low back surgery 2011. History of multiple myeloma.  EXAM: LUMBAR SPINE - 2-3 VIEW  COMPARISON:  Bone survey 03/16/2014.  FINDINGS: Postoperative changes with posterior rod and screw fixation from L2 through L4. Compression of the L3 vertebral body with anterior hypertrophic changes. This is unchanged since previous study. Normal alignment of the lumbar spine. Mild anterior wedging of T12 is unchanged since prior study. Bone marrow  appearance is more heterogeneous than on the previous study with suggestion of developing lytic lesions at T11, T12, and L1 levels. No acute fracture is demonstrated. Progressive narrowing of intervertebral disc space at L5-S1.  IMPRESSION: Normal alignment of the lumbar spine with unchanged appearance of T12 and L3 compression deformities. Postoperative changes  are stable. Developing lucent lesions suggested at T11, T12, and L1 levels. No acute fractures identified.   Electronically Signed   By: Lucienne Capers M.D.   On: 07/09/2014 17:45   Dg Pelvis 1-2 Views  07/09/2014   CLINICAL DATA:  Left hip pain.  Low back pain.  Multiple myeloma.  EXAM: PELVIS - 1-2 VIEW  COMPARISON:  CT scan dated 03/22/2014 and radiograph dated 03/16/2014  FINDINGS: There is no fracture or acute bone destruction. Lytic lesions seen in the left femoral neck. There is also suggestion of the lesion in the right inferior pubic ramus.  There are new areas of lucency in the greater trochanters bilaterally. Small synovial herniation pit in the right femoral neck.  The lesion in the left ischium is not visible on this exam.  IMPRESSION: No acute abnormality.  Progression of lytic lesions of myeloma.   Electronically Signed   By: Rozetta Nunnery M.D.   On: 07/09/2014 17:46   Mr Thoracic Spine W Wo Contrast  07/10/2014   CLINICAL DATA:  44 year old male with history of multiple myeloma. Nodule left flank with back pain which started today and is extending down left leg. Recent last chemotherapy treatment. Evaluate for possible cord compression. Subsequent encounter.  EXAM: MRI THORACIC AND LUMBAR SPINE WITHOUT AND WITH CONTRAST  TECHNIQUE: Multiplanar and multiecho pulse sequences of the thoracic and lumbar spine were obtained without and with intravenous contrast.  CONTRAST:  50m MULTIHANCE GADOBENATE DIMEGLUMINE 529 MG/ML IV SOLN  COMPARISON:  07/09/2014 plain film exam of the thoracic and lumbar spine. 03/01/2014 MR of the cervical and  thoracic spine. No comparison lumbar spine MR. Postmyelogram CT 08/23/2012. CT pelvis 03/22/2014.  FINDINGS: MR THORACIC SPINE FINDINGS  Single sagittal scout view obtained for proper level assignment through the cervical spine reveals that the patient is post fusion C6-7 level.  Multiple osseous lesions throughout the thoracic vertebra once again noted. No new thoracic spine lesion identified with some of the lesions appearing slightly smaller than on the prior exam.  No focal cord signal abnormality or abnormal enhancement.  Mild loss of height T9 superior and inferior endplate unchanged from the prior exam.  Myeloma involvement of the T7 vertebral body with expansion of the left posterior inferior aspect causes narrowing of the left ventral aspect of the thecal sac with minimal left-sided cord flattening similar to prior exam. There is a small left paracentral T7-8 disc protrusion just below this level.  T5-6 very small shallow right paracentral protrusion unchanged.  T6-7 very small left paracentral disc protrusion unchanged.  T9-10 minimal bulge.  T10-11 tiny right paracentral protrusion without significant change.  Several rib lesions, largest posterior right seventh rib which appears slightly larger and more expanded than on the prior exam now spanning over 4 x 1.5 cm versus prior 3.6 x 1.4 cm.  Manubrium and sternum with expansile destructive lesions incompletely assessed but without significant change.  Ascending thoracic aorta appears enlarged although incompletely assessed. CT angiogram of the chest may be considered for further delineation.  MR LUMBAR SPINE FINDINGS  Prior compression fracture of the L3 vertebral body predominantly involving the superior endplate with anterior wedge compression deformity and 70% loss of height anteriorly with retropulsion of the compressed vertebra which has been treated with fusion from the L2 through L4 with overall appearance similar to the prior postmyelogram CT.   Regions of altered signal intensity most consistent with involvement by myeloma involving the left L1 pedicle, possibly the mid aspect of the L2  vertebral body (metallic artifact limits evaluation), anterior inferior aspect of the L4 vertebral body, right S1 vertebral body junction with the facet, right aspect of S2, mid aspect S3 and left ilium. As prior lumbar spine MR is not available, it is difficult to determine if any of these lesions are new. There is however, no evidence of epidural spread of tumor causing cord/conus or nerve root compression.  Conus is at the L1-2 disc space level.  L1-2:  No significant spinal stenosis or foraminal narrowing.  L2-3: Retropulsion of the compressed L3 vertebra most notable left paracentral position with slight indentation of the ventral aspect of the thecal sac. Minimal narrowing of the inferior aspect of the neural foramen.  L3-4:  No significant spinal stenosis or foraminal narrowing.  L4-5: Facet joint degenerative changes and ligamentum flavum hypertrophy. Bulge. Multifactorial mild to slightly moderate spinal stenosis. Prominent degenerative changes at the L4-L5 spinous process articulation with small amount of fluid and mild enhancement. This is felt less likely to result of myeloma and was noted on the postmyelogram CT.  L5-S1: Mild to moderate facet joint degenerative changes. Bulge minimally more notable to left with slight encroachment upon but not compression of the origin of the left S1 nerve root. No significant thecal sac narrowing. Mild foraminal narrowing greater on the left approaching but not compressing the exiting L5 nerve roots.  IMPRESSION: MR THORACIC SPINE  Multiple osseous lesions throughout the thoracic vertebra once again noted consistent with patient's history myeloma. No new thoracic spine lesion identified with some of the lesions appearing slightly smaller than on the prior exam.  Myeloma involvement of the T7 vertebral body with expansion of  the left posterior inferior aspect of the vertebra causes narrowing of the left ventral aspect of the thecal sac with minimal left-sided cord flattening similar to prior exam. There is a small left paracentral T7-8 disc protrusion just below this level. These findings are without change.  Otherwise, no new finding of thoracic vertebra bony expansion/epidural spread of tumor.  Mild loss of height T9 superior and inferior endplate unchanged from the prior exam.  No significant change in scattered mild discogenic changes as discussed above.  Several rib lesions, largest posterior right seventh rib which appears slightly larger and more expanded than on the prior exam now spanning over 4 x 1.5 cm versus prior 3.6 x 1.4 cm.  Manubrium and sternum with expansile destructive lesions incompletely assessed but without significant change.  Ascending thoracic aorta appears enlarged although incompletely assessed. CT angiogram of the chest may be considered for further delineation.  MR LUMBAR SPINE  Remote compression fracture of the L3 vertebral body with retropulsion of the compressed vertebra treated with fusion from the L2 through L4 with overall appearance similar to the prior postmyelogram CT.  Regions of altered signal intensity most consistent with involvement by myeloma involving the left L1 pedicle, possibly the mid aspect of the L2 vertebral body (metallic artifact limits evaluation), anterior inferior aspect of the L4 vertebral body, right S1 vertebral body junction with the facet, right aspect of S2, mid aspect S3 and left ilium. As prior lumbar spine MR is not available, it is difficult to determine if any of these lesions are new. There is however, no evidence of epidural spread of tumor causing cord/conus or nerve root compression.  L4-5 multifactorial mild to slightly moderate spinal stenosis. Prominent degenerative changes at the L4-L5 spinous process articulation with small amount of fluid and mild  enhancement. This is felt less likely  to result of myeloma and was noted on the postmyelogram CT.  L5-S1 bold minimally more notable to left with slight encroachment upon but not compression of the origin of the left S1 nerve root. No significant thecal sac narrowing. Mild foraminal narrowing greater on the left approaching but not compressing the exiting L5 nerve roots.   Electronically Signed   By: Chauncey Cruel M.D.   On: 07/10/2014 16:56   Mr Lumbar Spine W Wo Contrast  07/10/2014   CLINICAL DATA:  44 year old male with history of multiple myeloma. Nodule left flank with back pain which started today and is extending down left leg. Recent last chemotherapy treatment. Evaluate for possible cord compression. Subsequent encounter.  EXAM: MRI THORACIC AND LUMBAR SPINE WITHOUT AND WITH CONTRAST  TECHNIQUE: Multiplanar and multiecho pulse sequences of the thoracic and lumbar spine were obtained without and with intravenous contrast.  CONTRAST:  76m MULTIHANCE GADOBENATE DIMEGLUMINE 529 MG/ML IV SOLN  COMPARISON:  07/09/2014 plain film exam of the thoracic and lumbar spine. 03/01/2014 MR of the cervical and thoracic spine. No comparison lumbar spine MR. Postmyelogram CT 08/23/2012. CT pelvis 03/22/2014.  FINDINGS: MR THORACIC SPINE FINDINGS  Single sagittal scout view obtained for proper level assignment through the cervical spine reveals that the patient is post fusion C6-7 level.  Multiple osseous lesions throughout the thoracic vertebra once again noted. No new thoracic spine lesion identified with some of the lesions appearing slightly smaller than on the prior exam.  No focal cord signal abnormality or abnormal enhancement.  Mild loss of height T9 superior and inferior endplate unchanged from the prior exam.  Myeloma involvement of the T7 vertebral body with expansion of the left posterior inferior aspect causes narrowing of the left ventral aspect of the thecal sac with minimal left-sided cord flattening  similar to prior exam. There is a small left paracentral T7-8 disc protrusion just below this level.  T5-6 very small shallow right paracentral protrusion unchanged.  T6-7 very small left paracentral disc protrusion unchanged.  T9-10 minimal bulge.  T10-11 tiny right paracentral protrusion without significant change.  Several rib lesions, largest posterior right seventh rib which appears slightly larger and more expanded than on the prior exam now spanning over 4 x 1.5 cm versus prior 3.6 x 1.4 cm.  Manubrium and sternum with expansile destructive lesions incompletely assessed but without significant change.  Ascending thoracic aorta appears enlarged although incompletely assessed. CT angiogram of the chest may be considered for further delineation.  MR LUMBAR SPINE FINDINGS  Prior compression fracture of the L3 vertebral body predominantly involving the superior endplate with anterior wedge compression deformity and 70% loss of height anteriorly with retropulsion of the compressed vertebra which has been treated with fusion from the L2 through L4 with overall appearance similar to the prior postmyelogram CT.  Regions of altered signal intensity most consistent with involvement by myeloma involving the left L1 pedicle, possibly the mid aspect of the L2 vertebral body (metallic artifact limits evaluation), anterior inferior aspect of the L4 vertebral body, right S1 vertebral body junction with the facet, right aspect of S2, mid aspect S3 and left ilium. As prior lumbar spine MR is not available, it is difficult to determine if any of these lesions are new. There is however, no evidence of epidural spread of tumor causing cord/conus or nerve root compression.  Conus is at the L1-2 disc space level.  L1-2:  No significant spinal stenosis or foraminal narrowing.  L2-3: Retropulsion of the compressed L3  vertebra most notable left paracentral position with slight indentation of the ventral aspect of the thecal sac.  Minimal narrowing of the inferior aspect of the neural foramen.  L3-4:  No significant spinal stenosis or foraminal narrowing.  L4-5: Facet joint degenerative changes and ligamentum flavum hypertrophy. Bulge. Multifactorial mild to slightly moderate spinal stenosis. Prominent degenerative changes at the L4-L5 spinous process articulation with small amount of fluid and mild enhancement. This is felt less likely to result of myeloma and was noted on the postmyelogram CT.  L5-S1: Mild to moderate facet joint degenerative changes. Bulge minimally more notable to left with slight encroachment upon but not compression of the origin of the left S1 nerve root. No significant thecal sac narrowing. Mild foraminal narrowing greater on the left approaching but not compressing the exiting L5 nerve roots.  IMPRESSION: MR THORACIC SPINE  Multiple osseous lesions throughout the thoracic vertebra once again noted consistent with patient's history myeloma. No new thoracic spine lesion identified with some of the lesions appearing slightly smaller than on the prior exam.  Myeloma involvement of the T7 vertebral body with expansion of the left posterior inferior aspect of the vertebra causes narrowing of the left ventral aspect of the thecal sac with minimal left-sided cord flattening similar to prior exam. There is a small left paracentral T7-8 disc protrusion just below this level. These findings are without change.  Otherwise, no new finding of thoracic vertebra bony expansion/epidural spread of tumor.  Mild loss of height T9 superior and inferior endplate unchanged from the prior exam.  No significant change in scattered mild discogenic changes as discussed above.  Several rib lesions, largest posterior right seventh rib which appears slightly larger and more expanded than on the prior exam now spanning over 4 x 1.5 cm versus prior 3.6 x 1.4 cm.  Manubrium and sternum with expansile destructive lesions incompletely assessed but  without significant change.  Ascending thoracic aorta appears enlarged although incompletely assessed. CT angiogram of the chest may be considered for further delineation.  MR LUMBAR SPINE  Remote compression fracture of the L3 vertebral body with retropulsion of the compressed vertebra treated with fusion from the L2 through L4 with overall appearance similar to the prior postmyelogram CT.  Regions of altered signal intensity most consistent with involvement by myeloma involving the left L1 pedicle, possibly the mid aspect of the L2 vertebral body (metallic artifact limits evaluation), anterior inferior aspect of the L4 vertebral body, right S1 vertebral body junction with the facet, right aspect of S2, mid aspect S3 and left ilium. As prior lumbar spine MR is not available, it is difficult to determine if any of these lesions are new. There is however, no evidence of epidural spread of tumor causing cord/conus or nerve root compression.  L4-5 multifactorial mild to slightly moderate spinal stenosis. Prominent degenerative changes at the L4-L5 spinous process articulation with small amount of fluid and mild enhancement. This is felt less likely to result of myeloma and was noted on the postmyelogram CT.  L5-S1 bold minimally more notable to left with slight encroachment upon but not compression of the origin of the left S1 nerve root. No significant thecal sac narrowing. Mild foraminal narrowing greater on the left approaching but not compressing the exiting L5 nerve roots.   Electronically Signed   By: Chauncey Cruel M.D.   On: 07/10/2014 16:56   Pelvis Portable  07/27/2014   CLINICAL DATA:  Status post left femoral nail.  EXAM: PORTABLE PELVIS 1-2 VIEWS  COMPARISON:  07/27/2014  FINDINGS: There has been interval placement of a left intra medullary rod and hip screw. The hardware components are in alignment. No complications identified.  IMPRESSION: 1. Satisfactory position of the hardware components after  placement of screw and inch medullary rod for lytic lesion in left femoral neck.   Electronically Signed   By: Kerby Moors M.D.   On: 07/27/2014 16:28   Dg C-arm 61-120 Min  07/27/2014   CLINICAL DATA:  ORIF of the left femur  EXAM: DG C-ARM 61-120 MIN; LEFT FEMUR 2 VIEWS  FLUOROSCOPY TIME:  Fluoroscopy Time (in minutes and seconds) as reported by the performing provider: 3 minutes, 27 seconds  Number of Acquired Images:  5  FINDINGS: The fluoro spot films reveal the patient to of undergone placement of an intra medullary rod with telescoping screw in the neck and intertrochanteric region of the left femur. A lytic lesion in the femoral neck is visible. There is no postprocedure complication.  IMPRESSION: The patient has undergone ORIF due to a lytic lesion in the left femoral neck. There is no immediate postprocedure complication.   Electronically Signed   By: David  [place]   On: 07/27/2014 13:52   Dg Femur Min 2 Views Left  07/27/2014   CLINICAL DATA:  44 year old male status post femoral nail placement.  EXAM: LEFT FEMUR 2 VIEWS  COMPARISON:  07/27/2014.  FINDINGS: Three views of the left femur demonstrate interval placement of a long intra medullary nail with a single distal fixation screw, and a proximal dynamic compression screw the femoral back. No fractures are noted.  IMPRESSION: 1. Interval placement of intramedullary nail in the left femur, as above.   Electronically Signed   By: Vinnie Langton M.D.   On: 07/27/2014 16:05   Dg Femur Min 2 Views Left  07/27/2014   CLINICAL DATA:  ORIF of the left femur  EXAM: DG C-ARM 61-120 MIN; LEFT FEMUR 2 VIEWS  FLUOROSCOPY TIME:  Fluoroscopy Time (in minutes and seconds) as reported by the performing provider: 3 minutes, 27 seconds  Number of Acquired Images:  5  FINDINGS: The fluoro spot films reveal the patient to of undergone placement of an intra medullary rod with telescoping screw in the neck and intertrochanteric region of the left femur. A lytic  lesion in the femoral neck is visible. There is no postprocedure complication.  IMPRESSION: The patient has undergone ORIF due to a lytic lesion in the left femoral neck. There is no immediate postprocedure complication.   Electronically Signed   By: David  [place]   On: 07/27/2014 13:52    Disposition: 01-Home or Self Care  Discharge Instructions    Call MD / Call 911    Complete by:  As directed   If you experience chest pain or shortness of breath, CALL 911 and be transported to the hospital emergency room.  If you develope a fever above 101 F, pus (white drainage) or increased drainage or redness at the wound, or calf pain, call your surgeon's office.     Constipation Prevention    Complete by:  As directed   Drink plenty of fluids.  Prune juice may be helpful.  You may use a stool softener, such as Colace (over the counter) 100 mg twice a day.  Use MiraLax (over the counter) for constipation as needed.     Diet - low sodium heart healthy    Complete by:  As directed      Discharge wound  care:    Complete by:  As directed   If you have a hip bandage, keep it clean and dry.  Change your bandage as instructed by your health care providers.  If your bandage has been discontinued, keep your incision clean and dry.  Pat dry after bathing.  DO NOT put lotion or powder on your incision.     Driving restrictions    Complete by:  As directed   No driving for 2 weeks     Increase activity slowly as tolerated    Complete by:  As directed      Lifting restrictions    Complete by:  As directed   No lifting for 6 weeks           Follow-up Information    Follow up with Saira Kramme, Horald Pollen, MD. Schedule an appointment as soon as possible for a visit in 2 weeks.   Specialty:  Orthopedic Surgery   Why:  For wound re-check   Contact information:   [address] [place] [address] [phone]        Signed: Elie Goody 07/29/2014, 6:58 AM

## 2014-08-08 ENCOUNTER — Other Ambulatory Visit: Payer: Self-pay | Admitting: Hematology and Oncology

## 2014-08-11 ENCOUNTER — Encounter: Payer: Self-pay | Admitting: Hematology and Oncology

## 2014-08-11 ENCOUNTER — Telehealth: Payer: Self-pay | Admitting: Hematology and Oncology

## 2014-08-11 ENCOUNTER — Ambulatory Visit (HOSPITAL_BASED_OUTPATIENT_CLINIC_OR_DEPARTMENT_OTHER): Payer: BC Managed Care – PPO | Admitting: Hematology and Oncology

## 2014-08-11 ENCOUNTER — Other Ambulatory Visit (HOSPITAL_BASED_OUTPATIENT_CLINIC_OR_DEPARTMENT_OTHER): Payer: BC Managed Care – PPO

## 2014-08-11 VITALS — BP 122/67 | HR 77 | Temp 98.3°F | Resp 17 | Ht 72.0 in | Wt 210.7 lb

## 2014-08-11 DIAGNOSIS — M899 Disorder of bone, unspecified: Secondary | ICD-10-CM

## 2014-08-11 DIAGNOSIS — C9000 Multiple myeloma not having achieved remission: Secondary | ICD-10-CM

## 2014-08-11 DIAGNOSIS — C7951 Secondary malignant neoplasm of bone: Secondary | ICD-10-CM

## 2014-08-11 DIAGNOSIS — M898X9 Other specified disorders of bone, unspecified site: Secondary | ICD-10-CM

## 2014-08-11 LAB — COMPREHENSIVE METABOLIC PANEL (CC13)
ALBUMIN: 3.5 g/dL (ref 3.5–5.0)
ALT: 26 U/L (ref 0–55)
AST: 39 U/L — AB (ref 5–34)
Alkaline Phosphatase: 85 U/L (ref 40–150)
Anion Gap: 11 mEq/L (ref 3–11)
BUN: 5.7 mg/dL — ABNORMAL LOW (ref 7.0–26.0)
CO2: 22 mEq/L (ref 22–29)
CREATININE: 0.7 mg/dL (ref 0.7–1.3)
Calcium: 9.3 mg/dL (ref 8.4–10.4)
Chloride: 106 mEq/L (ref 98–109)
EGFR: >90 mL/min/{1.73_m2} (ref >90)
Glucose: 111 mg/dl (ref 70–140)
POTASSIUM: 3.9 meq/L (ref 3.5–5.1)
Sodium: 139 mEq/L (ref 136–145)
Total Bilirubin: 0.48 mg/dL (ref 0.20–1.20)
Total Protein: 7.1 g/dL (ref 6.4–8.3)

## 2014-08-11 LAB — CBC WITH DIFFERENTIAL/PLATELET
BASO%: 0.8 % (ref 0.0–2.0)
Basophils Absolute: 0 10*3/uL (ref 0.0–0.1)
EOS%: 3.8 % (ref 0.0–7.0)
Eosinophils Absolute: 0.2 10*3/uL (ref 0.0–0.5)
HEMATOCRIT: 39.8 % (ref 38.4–49.9)
HEMOGLOBIN: 13.3 g/dL (ref 13.0–17.1)
LYMPH#: 1.7 10*3/uL (ref 0.9–3.3)
LYMPH%: 33.1 % (ref 14.0–49.0)
MCH: 27.8 pg (ref 27.2–33.4)
MCHC: 33.4 g/dL (ref 32.0–36.0)
MCV: 83.1 fL (ref 79.3–98.0)
MONO#: 0.4 10*3/uL (ref 0.1–0.9)
MONO%: 7 % (ref 0.0–14.0)
NEUT#: 2.8 10*3/uL (ref 1.5–6.5)
NEUT%: 55.3 % (ref 39.0–75.0)
Platelets: 335 10*3/uL (ref 140–400)
RBC: 4.79 10*6/uL (ref 4.20–5.82)
RDW: 13.8 % (ref 11.0–14.6)
WBC: 5 10*3/uL (ref 4.0–10.3)

## 2014-08-11 MED ORDER — ACYCLOVIR 400 MG PO TABS: 400.0000 mg | ORAL_TABLET | Freq: Two times a day (BID) | ORAL | Status: AC

## 2014-08-11 MED ORDER — FENTANYL 25 MCG/HR TD PT72: 25.0000 ug | MEDICATED_PATCH | TRANSDERMAL | Status: DC

## 2014-08-11 MED ORDER — HYDROMORPHONE HCL 4 MG PO TABS: 4.0000 mg | ORAL_TABLET | Freq: Four times a day (QID) | ORAL | Status: DC | PRN

## 2014-08-11 MED ORDER — PROCHLORPERAZINE MALEATE 10 MG PO TABS
10.0000 mg | ORAL_TABLET | Freq: Four times a day (QID) | ORAL | Status: DC | PRN
Start: 2014-08-11 — End: 2014-09-20

## 2014-08-11 NOTE — Progress Notes (Signed)
[place] OFFICE PROGRESS NOTE  Patient Care Team: Jeanelle Malling, PA-C as PCP - General  SUMMARY OF ONCOLOGIC HISTORY:   Multiple myeloma   03/16/2014 Imaging Skeletal survey showed Multiple osseous lesions as as above supporting diagnosis of multiple myeloma   03/22/2014 Bone Marrow Biopsy Accession: [id number] bone marrow biopsy, from multiple myeloma.   04/10/2014 - 06/26/2014 Chemotherapy He received induction chemotherapy with Revlimid, Velcade and dexamethasone.   06/12/2014 - 07/10/2014 Chemotherapy He received maintenance treatment with Revlimid alone. Treatment was held in anticipation for surgery.   07/10/2014 Imaging CT scan and MRI show significant compression fractures and impeding fracture in the left femoral neck.   07/27/2014 - 07/28/2014 Hospital Admission  the patient was admitted to the hospital for elective left hip surgery.   07/27/2014 Surgery He underwent prophylactic cephalomedullary nail placement, left femur.    INTERVAL HISTORY: Please see below for problem oriented charting.  he returns today for further follow-up. He did well from recent surgery.  he continues to have persistent pain in the back and his hip, unchanged compared to baseline. He is taking anticoagulation therapy. The patient denies any recent signs or symptoms of bleeding such as spontaneous epistaxis, hematuria or hematochezia.   REVIEW OF SYSTEMS:   Constitutional: Denies fevers, chills or abnormal weight loss Eyes: Denies blurriness of vision Ears, nose, mouth, throat, and face: Denies mucositis or sore throat Respiratory: Denies cough, dyspnea or wheezes Cardiovascular: Denies palpitation, chest discomfort or lower extremity swelling Gastrointestinal:  Denies nausea, heartburn or change in bowel habits Skin: Denies abnormal skin rashes Lymphatics: Denies new lymphadenopathy or easy bruising Neurological:Denies numbness, tingling or new weaknesses Behavioral/Psych: Mood is  stable, no new changes  All other systems were reviewed with the patient and are negative.  I have reviewed the past medical history, past surgical history, social history and family history with the patient and they are unchanged from previous note.  ALLERGIES:  is allergic to aleve and zofran.  MEDICATIONS:  Current Outpatient Prescriptions  Medication Sig Dispense Refill  . acyclovir (ZOVIRAX) 400 MG tablet Take 1 tablet (400 mg total) by mouth 2 (two) times daily. 60 tablet 2  . diazepam (VALIUM) 5 MG tablet Take 1 tablet (5 mg total) by mouth at bedtime. 30 tablet 1  . fentaNYL (DURAGESIC - DOSED MCG/HR) 25 MCG/HR patch Place 1 patch (25 mcg total) onto the skin every 3 (three) days. 10 patch 0  . gabapentin (NEURONTIN) 600 MG tablet Take 1 tablet (600 mg total) by mouth 3 (three) times daily. 90 tablet 1  . gabapentin (NEURONTIN) 600 MG tablet Take 1 tablet (600 mg total) by mouth 3 (three) times daily. 90 tablet 0  . HYDROmorphone (DILAUDID) 4 MG tablet Take 1 tablet (4 mg total) by mouth every 6 (six) hours as needed for severe pain. 90 tablet 0  . insulin aspart protamine - aspart (NOVOLOG 70/30 MIX) (70-30) 100 UNIT/ML FlexPen Inject 17 units before breakfast;  14 units before supper for DM Type 2    . LORazepam (ATIVAN) 0.5 MG tablet Take 1 tablet (0.5 mg total) by mouth every 6 (six) hours as needed for anxiety. 60 tablet 0  . metFORMIN (GLUCOPHAGE) 500 MG tablet Take 1,000 mg by mouth 2 (two) times daily with a meal.    . prochlorperazine (COMPAZINE) 10 MG tablet Take 1 tablet (10 mg total) by mouth every 6 (six) hours as needed (Nausea or vomiting). 30 tablet 3  . ranitidine (ZANTAC) 150 MG  tablet Take 300 mg by mouth daily as needed for heartburn.     . Rivaroxaban (XARELTO) 10 MG TABS tablet Take 1 tablet (10 mg total) by mouth daily with supper. 14 tablet 0   No current facility-administered medications for this visit.    PHYSICAL EXAMINATION: ECOG PERFORMANCE STATUS: 0 -  Asymptomatic  Filed Vitals:   08/11/14 1414  BP: 122/67  Pulse: 77  Temp: 98.3 F (36.8 C)  Resp: 17   Filed Weights   08/11/14 1414  Weight: 210 lb 11.2 oz (95.573 kg)    GENERAL:alert, no distress and comfortable SKIN: skin color, texture, turgor are normal, no rashes or significant lesions EYES: normal, Conjunctiva are pink and non-injected, sclera clear OROPHARYNX:no exudate, no erythema and lips, buccal mucosa, and tongue normal  NECK: supple, thyroid normal size, non-tender, without nodularity LYMPH:  no palpable lymphadenopathy in the cervical, axillary or inguinal LUNGS: clear to auscultation and percussion with normal breathing effort HEART: regular rate & rhythm and no murmurs and no lower extremity edema ABDOMEN:abdomen soft, non-tender and normal bowel sounds Musculoskeletal:no cyanosis of digits and no clubbing  NEURO: alert & oriented x 3 with fluent speech, no focal motor/sensory deficits  LABORATORY DATA:  I have reviewed the data as listed    Component Value Date/Time   NA 139 08/11/2014 1358   NA 133* 07/28/2014 0500   K 3.9 08/11/2014 1358   K 3.9 07/28/2014 0500   CL 99 07/28/2014 0500   CO2 22 08/11/2014 1358   CO2 23 07/28/2014 0500   GLUCOSE 111 08/11/2014 1358   GLUCOSE 223* 07/28/2014 0500   BUN 5.7* 08/11/2014 1358   BUN 9 07/28/2014 0500   CREATININE 0.7 08/11/2014 1358   CREATININE 0.78 07/28/2014 0500   CALCIUM 9.3 08/11/2014 1358   CALCIUM 8.8 07/28/2014 0500   PROT 7.1 08/11/2014 1358   PROT 6.5 07/09/2014 1528   ALBUMIN 3.5 08/11/2014 1358   ALBUMIN 3.7 07/09/2014 1528   AST 39* 08/11/2014 1358   AST 41* 07/09/2014 1528   ALT 26 08/11/2014 1358   ALT 35 07/09/2014 1528   ALKPHOS 85 08/11/2014 1358   ALKPHOS 75 07/09/2014 1528   BILITOT 0.48 08/11/2014 1358   BILITOT 0.6 07/09/2014 1528   GFRNONAA >90 07/28/2014 0500   GFRAA >90 07/28/2014 0500    No results found for: SPEP, UPEP  Lab Results  Component Value Date   WBC  5.0 08/11/2014   NEUTROABS 2.8 08/11/2014   HGB 13.3 08/11/2014   HCT 39.8 08/11/2014   MCV 83.1 08/11/2014   PLT 335 08/11/2014      Chemistry      Component Value Date/Time   NA 139 08/11/2014 1358   NA 133* 07/28/2014 0500   K 3.9 08/11/2014 1358   K 3.9 07/28/2014 0500   CL 99 07/28/2014 0500   CO2 22 08/11/2014 1358   CO2 23 07/28/2014 0500   BUN 5.7* 08/11/2014 1358   BUN 9 07/28/2014 0500   CREATININE 0.7 08/11/2014 1358   CREATININE 0.78 07/28/2014 0500      Component Value Date/Time   CALCIUM 9.3 08/11/2014 1358   CALCIUM 8.8 07/28/2014 0500   ALKPHOS 85 08/11/2014 1358   ALKPHOS 75 07/09/2014 1528   AST 39* 08/11/2014 1358   AST 41* 07/09/2014 1528   ALT 26 08/11/2014 1358   ALT 35 07/09/2014 1528   BILITOT 0.48 08/11/2014 1358   BILITOT 0.6 07/09/2014 1528      ASSESSMENT & PLAN:  Multiple myeloma  I will continue to hold treatment. I will refer him to radiation oncologist to proceed with radiation therapy before we resume systemic treatment. In the meantime, continue supportive care.   Bone metastases Patient has known bone metastasis to both the thoracic and lumbar region; as well as multiple ribs.  Patient is currently undergoing Zometa infusions on an every three-month basis.  His last Zometa infusion was 06/19/2014.  He'll be due for his next Zometa infusion in mid April 2016.  I refill on his prescription for pain today.     No orders of the defined types were placed in this encounter.   All questions were answered. The patient knows to call the clinic with any problems, questions or concerns. No barriers to learning was detected. I spent 15 minutes counseling the patient face to face. The total time spent in the appointment was 20 minutes and more than 50% was on counseling and review of test results     [facility], [place], MD 08/11/2014 3:31 PM

## 2014-08-11 NOTE — Assessment & Plan Note (Signed)
Patient has known bone metastasis to both the thoracic and lumbar region; as well as multiple ribs.  Patient is currently undergoing Zometa infusions on an every three-month basis.  His last Zometa infusion was 06/19/2014.  He'll be due for his next Zometa infusion in mid April 2016.  I refill on his prescription for pain today.

## 2014-08-11 NOTE — Telephone Encounter (Signed)
appts made and printed avs for pt  Dylan Benson

## 2014-08-11 NOTE — Assessment & Plan Note (Signed)
I will continue to hold treatment. I will refer him to radiation oncologist to proceed with radiation therapy before we resume systemic treatment. In the meantime, continue supportive care.

## 2014-08-15 LAB — BETA 2 MICROGLOBULIN, SERUM: BETA 2 MICROGLOBULIN: 1.66 mg/L (ref <=2.51)

## 2014-08-15 LAB — SPEP & IFE WITH QIG
Albumin ELP: 54.3 % — ABNORMAL LOW (ref 55.8–66.1)
Alpha-1-Globulin: 5.7 % — ABNORMAL HIGH (ref 2.9–4.9)
Alpha-2-Globulin: 11.4 % (ref 7.1–11.8)
Beta 2: 7.1 % — ABNORMAL HIGH (ref 3.2–6.5)
Beta Globulin: 6.5 % (ref 4.7–7.2)
GAMMA GLOBULIN: 15 % (ref 11.1–18.8)
IGG (IMMUNOGLOBIN G), SERUM: 1040 mg/dL (ref 650–1600)
IGM, SERUM: 36 mg/dL — AB (ref 41–251)
IgA: 256 mg/dL (ref 68–379)
M-SPIKE, %: 0.4 g/dL
TOTAL PROTEIN, SERUM ELECTROPHOR: 6.8 g/dL (ref 6.0–8.3)

## 2014-08-15 LAB — KAPPA/LAMBDA LIGHT CHAINS
KAPPA FREE LGHT CHN: 3.33 mg/dL — AB (ref 0.33–1.94)
Kappa:Lambda Ratio: 2.12 — ABNORMAL HIGH (ref 0.26–1.65)
LAMBDA FREE LGHT CHN: 1.57 mg/dL (ref 0.57–2.63)

## 2014-08-17 ENCOUNTER — Encounter (HOSPITAL_COMMUNITY): Payer: Self-pay | Admitting: Orthopedic Surgery

## 2014-08-17 NOTE — Addendum Note (Signed)
Addendum  created 08/17/14 0715 by Kate Sable, MD   Modules edited: Anesthesia Events

## 2014-08-23 NOTE — Progress Notes (Signed)
On 07/27/14 had a Prophylactic cephalomedullary nail placement, left femur by Elie Goody, MD

## 2014-08-24 ENCOUNTER — Ambulatory Visit
Admission: RE | Admit: 2014-08-24 | Discharge: 2014-08-24 | Disposition: A | Discharge status: Home / Self Care | Payer: BC Managed Care – PPO | Source: Ambulatory Visit | Attending: Radiation Oncology | Admitting: Radiation Oncology

## 2014-08-24 ENCOUNTER — Encounter: Payer: Self-pay | Admitting: Radiation Oncology

## 2014-08-24 ENCOUNTER — Telehealth: Payer: Self-pay | Admitting: *Deleted

## 2014-08-24 ENCOUNTER — Ambulatory Visit: Payer: BC Managed Care – PPO | Admitting: Radiation Oncology

## 2014-08-24 VITALS — BP 128/79 | HR 79 | Temp 98.3°F | Resp 12 | Wt 211.5 lb

## 2014-08-24 DIAGNOSIS — C9000 Multiple myeloma not having achieved remission: Secondary | ICD-10-CM

## 2014-08-24 DIAGNOSIS — Z510 Encounter for antineoplastic radiation therapy: Secondary | ICD-10-CM | POA: Diagnosis not present

## 2014-08-24 NOTE — Progress Notes (Signed)
On 07/27/14 had a Prophylactic cephalomedullary nail placement, left femur by Elie Goody, MD  He rates his pain as a 5 on a scale of 0-10. constant and dull over left femur and lower back. Manages pain with multiple pain medications. Noted gate with a slight limp on the left side, pt reports he is feels steady and balanced when he is walking. Denies numbness and tingling to lower extremities.  Denies urinary,bowel or swallowing abnormalities BP 128/79 mmHg  Pulse 79  Temp(Src) 98.3 F (36.8 C) (Oral)  Resp 12  Wt 211 lb 8 oz (95.936 kg)  SpO2 99%

## 2014-08-24 NOTE — Telephone Encounter (Signed)
Refill request for Valium and Ativan received from [facility].  Requests placed on Dr. Filiberto Pinks desk.

## 2014-08-24 NOTE — Progress Notes (Signed)
CC: Dr. Heath Lark, Dr. Rod Can  Follow-up note:  Diagnosis: Multiple myeloma  Dylan Benson visits today for review and scheduling of postoperative radiation therapy to his left proximal femur in the management of his myeloma.  I last saw him in consultation on 07/12/2014.  He is being evaluated for low back pain and was also noted to have a lytic lesion along the left femoral neck measuring 3.7 cm, felt to be at risk for a pathologic fracture.  Please refer to my note from 07/12/2014 regarding his presentation and previous therapies.  He was seen by Dr. Lyla Glassing of [facility] who placed an intramedullary nail to include the proximal left femur and shaft.  He was also concerned about a lytic lesion seen to involve the right inferior pubis bone.  The patient is doing relatively well.  His chronic low back pain is unchanged.  He does report new pain along his right groin which he feels may be from a change in his gait postoperatively, or conceivably from his right inferior pubis disease involvement.  He plans on going back to work in the near future.  Physical examination: Alert and oriented.  His left lateral hip/thigh wounds are well-healed.  Neurovascular intact.  Laboratory data: Lab Results  Component Value Date   WBC 5.0 08/11/2014   HGB 13.3 08/11/2014   HCT 39.8 08/11/2014   MCV 83.1 08/11/2014   PLT 335 08/11/2014   Impression: Myeloma.  I feel he would benefit from a two-week course of postoperative radiation therapy to his left proximal femur and also 2 weeks to his right inferior pubis. I offered to move ahead with his CT simulation and treatment today, but he would like to wait until early April.  I discussed the potential acute and late toxicities of treatment which should be well tolerated.  Consent is signed today.  Plan: As above.  30 minutes was spent face-to-face with the patient, primarily counseling patient, reviewing his radiographs, and coordinating his  care.

## 2014-08-25 ENCOUNTER — Telehealth: Payer: Self-pay | Admitting: *Deleted

## 2014-08-25 NOTE — Telephone Encounter (Signed)
Received refill requests for Ativan and Lorazepam from pt's pharmacy.  Dr. Alvy Bimler replied she does not manage anxiety or prescribed anti anxiety meds long term. She instructs for pt to get these refills from his PCP.  Notified pt of above and he verbalized understanding.

## 2014-08-25 NOTE — Telephone Encounter (Signed)
Please. And tell him he needs another provider to prescribe this if he needs this long term I can handle narcotic prescription but generally do not prescribe these 2 long term

## 2014-08-29 ENCOUNTER — Encounter: Payer: Self-pay | Admitting: Radiation Oncology

## 2014-09-04 ENCOUNTER — Ambulatory Visit
Admission: RE | Admit: 2014-09-04 | Discharge: 2014-09-04 | Disposition: A | Discharge status: Home / Self Care | Payer: BC Managed Care – PPO | Source: Ambulatory Visit | Attending: Radiation Oncology | Admitting: Radiation Oncology

## 2014-09-04 DIAGNOSIS — Z510 Encounter for antineoplastic radiation therapy: Secondary | ICD-10-CM | POA: Diagnosis not present

## 2014-09-04 DIAGNOSIS — C9000 Multiple myeloma not having achieved remission: Secondary | ICD-10-CM

## 2014-09-04 NOTE — Progress Notes (Signed)
Complex simulation/treatment planning note: The patient was taken to the CT simulator and placed supine.  A Vac lock immobilization device was constructed.  His pelvis was then scanned.  The CT data set was sent to the planning system where I contoured his lytic disease along the right inferior pubic ramus and also his left proximal femur/femoral neck.  He was set up AP PA to both fields.  2 separate multileaf collimators were utilized for the right pubis and one set of multileaf collimators used for the left femur for a total of 3 unique MLCs  plus the Vac lock immobilization device for a total of 4 complex treatment devices.  I prescribing 2250 cGy in 10 fractions.

## 2014-09-06 DIAGNOSIS — Z510 Encounter for antineoplastic radiation therapy: Secondary | ICD-10-CM | POA: Diagnosis not present

## 2014-09-07 ENCOUNTER — Encounter: Payer: Self-pay | Admitting: Hematology and Oncology

## 2014-09-07 ENCOUNTER — Encounter: Payer: Self-pay | Admitting: Radiation Oncology

## 2014-09-07 DIAGNOSIS — Z510 Encounter for antineoplastic radiation therapy: Secondary | ICD-10-CM | POA: Diagnosis not present

## 2014-09-11 ENCOUNTER — Telehealth: Payer: Self-pay | Admitting: Hematology and Oncology

## 2014-09-11 ENCOUNTER — Encounter: Payer: Self-pay | Admitting: Hematology and Oncology

## 2014-09-11 ENCOUNTER — Ambulatory Visit
Admission: RE | Admit: 2014-09-11 | Discharge: 2014-09-11 | Disposition: A | Discharge status: Home / Self Care | Payer: BC Managed Care – PPO | Source: Ambulatory Visit | Attending: Radiation Oncology | Admitting: Radiation Oncology

## 2014-09-11 ENCOUNTER — Ambulatory Visit (HOSPITAL_BASED_OUTPATIENT_CLINIC_OR_DEPARTMENT_OTHER): Payer: BC Managed Care – PPO | Admitting: Hematology and Oncology

## 2014-09-11 VITALS — BP 127/68 | HR 93 | Temp 98.1°F | Resp 18 | Ht 72.0 in | Wt 217.6 lb

## 2014-09-11 DIAGNOSIS — M5412 Radiculopathy, cervical region: Secondary | ICD-10-CM

## 2014-09-11 DIAGNOSIS — M899 Disorder of bone, unspecified: Secondary | ICD-10-CM

## 2014-09-11 DIAGNOSIS — C7951 Secondary malignant neoplasm of bone: Secondary | ICD-10-CM

## 2014-09-11 DIAGNOSIS — C9000 Multiple myeloma not having achieved remission: Secondary | ICD-10-CM | POA: Diagnosis not present

## 2014-09-11 DIAGNOSIS — F418 Other specified anxiety disorders: Secondary | ICD-10-CM | POA: Diagnosis not present

## 2014-09-11 DIAGNOSIS — F419 Anxiety disorder, unspecified: Secondary | ICD-10-CM

## 2014-09-11 DIAGNOSIS — Z510 Encounter for antineoplastic radiation therapy: Secondary | ICD-10-CM | POA: Diagnosis not present

## 2014-09-11 MED ORDER — HYDROMORPHONE HCL 4 MG PO TABS: 4.0000 mg | ORAL_TABLET | Freq: Four times a day (QID) | ORAL | Status: DC | PRN

## 2014-09-11 MED ORDER — FENTANYL 25 MCG/HR TD PT72: 25.0000 ug | MEDICATED_PATCH | TRANSDERMAL | Status: DC

## 2014-09-11 MED ORDER — GABAPENTIN 600 MG PO TABS: 600.0000 mg | ORAL_TABLET | Freq: Three times a day (TID) | ORAL | Status: DC

## 2014-09-11 NOTE — Telephone Encounter (Signed)
Gave patient avs report and appointment for May

## 2014-09-12 ENCOUNTER — Other Ambulatory Visit: Payer: Self-pay | Admitting: Hematology and Oncology

## 2014-09-12 ENCOUNTER — Ambulatory Visit
Admission: RE | Admit: 2014-09-12 | Discharge: 2014-09-12 | Disposition: A | Discharge status: Home / Self Care | Payer: BC Managed Care – PPO | Source: Ambulatory Visit | Attending: Radiation Oncology | Admitting: Radiation Oncology

## 2014-09-12 ENCOUNTER — Other Ambulatory Visit: Payer: Self-pay | Admitting: *Deleted

## 2014-09-12 DIAGNOSIS — Z510 Encounter for antineoplastic radiation therapy: Secondary | ICD-10-CM | POA: Diagnosis not present

## 2014-09-12 NOTE — Assessment & Plan Note (Signed)
I will continue to hold treatment. The patient is scheduled to start radiation treatment. I plan to see him back within a month from now to discuss the plan whether to resume chemotherapy versus proceeding with bone marrow transplant. In the meantime, I will provide supportive care

## 2014-09-12 NOTE — Telephone Encounter (Signed)
Refill on Ativan called into [facility].  Notified wife of refill. She verbalized understanding.

## 2014-09-12 NOTE — Progress Notes (Signed)
[place] OFFICE PROGRESS NOTE  Patient Care Team: Jeanelle Malling, PA-C as PCP - General  SUMMARY OF ONCOLOGIC HISTORY:   Multiple myeloma   03/16/2014 Imaging Skeletal survey showed Multiple osseous lesions as as above supporting diagnosis of multiple myeloma   03/22/2014 Bone Marrow Biopsy Accession: [id number] bone marrow biopsy, from multiple myeloma.   04/10/2014 - 06/26/2014 Chemotherapy He received induction chemotherapy with Revlimid, Velcade and dexamethasone.   06/12/2014 - 07/10/2014 Chemotherapy He received maintenance treatment with Revlimid alone. Treatment was held in anticipation for surgery.   07/10/2014 Imaging CT scan and MRI show significant compression fractures and impeding fracture in the left femoral neck.   07/27/2014 - 07/28/2014 Hospital Admission  the patient was admitted to the hospital for elective left hip surgery.   07/27/2014 Surgery He underwent prophylactic cephalomedullary nail placement, left femur.    INTERVAL HISTORY: Please see below for problem oriented charting. He returns for further supportive care. He continues to have chronic and diffuse bone pain. He will be undergoing radiation treatment.  REVIEW OF SYSTEMS:   Constitutional: Denies fevers, chills or abnormal weight loss Eyes: Denies blurriness of vision Ears, nose, mouth, throat, and face: Denies mucositis or sore throat Respiratory: Denies cough, dyspnea or wheezes Cardiovascular: Denies palpitation, chest discomfort or lower extremity swelling Gastrointestinal:  Denies nausea, heartburn or change in bowel habits Skin: Denies abnormal skin rashes Lymphatics: Denies new lymphadenopathy or easy bruising Neurological:Denies numbness, tingling or new weaknesses Behavioral/Psych: Mood is stable, no new changes  All other systems were reviewed with the patient and are negative.  I have reviewed the past medical history, past surgical history, social history and family history  with the patient and they are unchanged from previous note.  ALLERGIES:  is allergic to aleve and zofran.  MEDICATIONS:  Current Outpatient Prescriptions  Medication Sig Dispense Refill  . acyclovir (ZOVIRAX) 400 MG tablet Take 1 tablet (400 mg total) by mouth 2 (two) times daily. 60 tablet 2  . diazepam (VALIUM) 5 MG tablet Take 1 tablet (5 mg total) by mouth at bedtime. 30 tablet 1  . fentaNYL (DURAGESIC - DOSED MCG/HR) 25 MCG/HR patch Place 1 patch (25 mcg total) onto the skin every 3 (three) days. 10 patch 0  . gabapentin (NEURONTIN) 600 MG tablet Take 1 tablet (600 mg total) by mouth 3 (three) times daily. 90 tablet 3  . HYDROmorphone (DILAUDID) 4 MG tablet Take 1 tablet (4 mg total) by mouth every 6 (six) hours as needed for severe pain. 90 tablet 0  . insulin aspart protamine - aspart (NOVOLOG 70/30 MIX) (70-30) 100 UNIT/ML FlexPen Inject 17 units before breakfast;  14 units before supper for DM Type 2    . metFORMIN (GLUCOPHAGE) 500 MG tablet Take 1,000 mg by mouth 2 (two) times daily with a meal.    . prochlorperazine (COMPAZINE) 10 MG tablet Take 1 tablet (10 mg total) by mouth every 6 (six) hours as needed (Nausea or vomiting). 30 tablet 3  . ranitidine (ZANTAC) 150 MG tablet Take 300 mg by mouth daily as needed for heartburn.     . Rivaroxaban (XARELTO) 10 MG TABS tablet Take 1 tablet (10 mg total) by mouth daily with supper. 14 tablet 0  . LORazepam (ATIVAN) 0.5 MG tablet TAKE (1) TABLET BY MOUTH EVERY 6 HOURS AS NEEDED FOR ANXIETY. 60 tablet 0   No current facility-administered medications for this visit.    PHYSICAL EXAMINATION: ECOG PERFORMANCE STATUS: 0 - Asymptomatic  Filed  Vitals:   09/11/14 1346  BP: 127/68  Pulse: 93  Temp: 98.1 F (36.7 C)  Resp: 18   Filed Weights   09/11/14 1346  Weight: 217 lb 9.6 oz (98.703 kg)    GENERAL:alert, no distress and comfortable SKIN: skin color, texture, turgor are normal, no rashes or significant lesions. Diffuse skin rash  consistent with sunburn EYES: normal, Conjunctiva are pink and non-injected, sclera clear OROPHARYNX:no exudate, no erythema and lips, buccal mucosa, and tongue normal  Musculoskeletal:no cyanosis of digits and no clubbing  NEURO: alert & oriented x 3 with fluent speech, no focal motor/sensory deficits  LABORATORY DATA:  I have reviewed the data as listed    Component Value Date/Time   NA 139 08/11/2014 1358   NA 133* 07/28/2014 0500   K 3.9 08/11/2014 1358   K 3.9 07/28/2014 0500   CL 99 07/28/2014 0500   CO2 22 08/11/2014 1358   CO2 23 07/28/2014 0500   GLUCOSE 111 08/11/2014 1358   GLUCOSE 223* 07/28/2014 0500   BUN 5.7* 08/11/2014 1358   BUN 9 07/28/2014 0500   CREATININE 0.7 08/11/2014 1358   CREATININE 0.78 07/28/2014 0500   CALCIUM 9.3 08/11/2014 1358   CALCIUM 8.8 07/28/2014 0500   PROT 7.1 08/11/2014 1358   PROT 6.5 07/09/2014 1528   ALBUMIN 3.5 08/11/2014 1358   ALBUMIN 3.7 07/09/2014 1528   AST 39* 08/11/2014 1358   AST 41* 07/09/2014 1528   ALT 26 08/11/2014 1358   ALT 35 07/09/2014 1528   ALKPHOS 85 08/11/2014 1358   ALKPHOS 75 07/09/2014 1528   BILITOT 0.48 08/11/2014 1358   BILITOT 0.6 07/09/2014 1528   GFRNONAA >90 07/28/2014 0500   GFRAA >90 07/28/2014 0500    No results found for: SPEP, UPEP  Lab Results  Component Value Date   WBC 5.0 08/11/2014   NEUTROABS 2.8 08/11/2014   HGB 13.3 08/11/2014   HCT 39.8 08/11/2014   MCV 83.1 08/11/2014   PLT 335 08/11/2014      Chemistry      Component Value Date/Time   NA 139 08/11/2014 1358   NA 133* 07/28/2014 0500   K 3.9 08/11/2014 1358   K 3.9 07/28/2014 0500   CL 99 07/28/2014 0500   CO2 22 08/11/2014 1358   CO2 23 07/28/2014 0500   BUN 5.7* 08/11/2014 1358   BUN 9 07/28/2014 0500   CREATININE 0.7 08/11/2014 1358   CREATININE 0.78 07/28/2014 0500      Component Value Date/Time   CALCIUM 9.3 08/11/2014 1358   CALCIUM 8.8 07/28/2014 0500   ALKPHOS 85 08/11/2014 1358   ALKPHOS 75  07/09/2014 1528   AST 39* 08/11/2014 1358   AST 41* 07/09/2014 1528   ALT 26 08/11/2014 1358   ALT 35 07/09/2014 1528   BILITOT 0.48 08/11/2014 1358   BILITOT 0.6 07/09/2014 1528      ASSESSMENT & PLAN:  Multiple myeloma I will continue to hold treatment. The patient is scheduled to start radiation treatment. I plan to see him back within a month from now to discuss the plan whether to resume chemotherapy versus proceeding with bone marrow transplant. In the meantime, I will provide supportive care   Anxiety disorder He has chronic anxiety disorder and requests refill of his prescription of Ativan. I will refill it now but I plan to switch him over to something different so that he does not develop dependency on Ativan      Bone metastases Patient has known  bone metastasis to both the thoracic and lumbar region; as well as multiple ribs.  Patient is currently undergoing Zometa infusions on an every three-month basis.  His last Zometa infusion was 06/19/2014.  He'll be due for his next Zometa infusion in mid April 2016. I plan to hold that appointment due to ongoing radiation treatment. I refill on his prescription for pain today. Once he completes all treatment, I plan to start weaning him off his prescription pain medicine       No orders of the defined types were placed in this encounter.   All questions were answered. The patient knows to call the clinic with any problems, questions or concerns. No barriers to learning was detected. I spent 15 minutes counseling the patient face to face. The total time spent in the appointment was 20 minutes and more than 50% was on counseling and review of test results     [facility], [place], MD 09/12/2014 2:52 PM

## 2014-09-12 NOTE — Assessment & Plan Note (Signed)
He has chronic anxiety disorder and requests refill of his prescription of Ativan. I will refill it now but I plan to switch him over to something different so that he does not develop dependency on Ativan

## 2014-09-12 NOTE — Assessment & Plan Note (Signed)
Patient has known bone metastasis to both the thoracic and lumbar region; as well as multiple ribs.  Patient is currently undergoing Zometa infusions on an every three-month basis.  His last Zometa infusion was 06/19/2014.  He'll be due for his next Zometa infusion in mid April 2016. I plan to hold that appointment due to ongoing radiation treatment. I refill on his prescription for pain today. Once he completes all treatment, I plan to start weaning him off his prescription pain medicine

## 2014-09-13 ENCOUNTER — Ambulatory Visit
Admission: RE | Admit: 2014-09-13 | Discharge: 2014-09-13 | Disposition: A | Discharge status: Home / Self Care | Payer: BC Managed Care – PPO | Source: Ambulatory Visit | Attending: Radiation Oncology | Admitting: Radiation Oncology

## 2014-09-13 DIAGNOSIS — Z510 Encounter for antineoplastic radiation therapy: Secondary | ICD-10-CM | POA: Diagnosis not present

## 2014-09-14 ENCOUNTER — Ambulatory Visit
Admission: RE | Admit: 2014-09-14 | Discharge: 2014-09-14 | Disposition: A | Discharge status: Home / Self Care | Payer: BC Managed Care – PPO | Source: Ambulatory Visit | Attending: Radiation Oncology | Admitting: Radiation Oncology

## 2014-09-14 DIAGNOSIS — Z510 Encounter for antineoplastic radiation therapy: Secondary | ICD-10-CM | POA: Diagnosis not present

## 2014-09-15 ENCOUNTER — Ambulatory Visit
Admission: RE | Admit: 2014-09-15 | Discharge: 2014-09-15 | Disposition: A | Discharge status: Home / Self Care | Payer: BC Managed Care – PPO | Source: Ambulatory Visit | Attending: Radiation Oncology | Admitting: Radiation Oncology

## 2014-09-15 DIAGNOSIS — Z510 Encounter for antineoplastic radiation therapy: Secondary | ICD-10-CM | POA: Diagnosis not present

## 2014-09-18 ENCOUNTER — Ambulatory Visit
Admission: RE | Admit: 2014-09-18 | Discharge: 2014-09-18 | Disposition: A | Discharge status: Home / Self Care | Payer: BC Managed Care – PPO | Source: Ambulatory Visit | Attending: Radiation Oncology | Admitting: Radiation Oncology

## 2014-09-18 ENCOUNTER — Encounter: Payer: Self-pay | Admitting: Hematology and Oncology

## 2014-09-18 ENCOUNTER — Encounter: Payer: Self-pay | Admitting: Radiation Oncology

## 2014-09-18 VITALS — BP 144/85 | HR 96 | Temp 98.7°F | Ht 72.0 in | Wt 219.4 lb

## 2014-09-18 DIAGNOSIS — C9000 Multiple myeloma not having achieved remission: Secondary | ICD-10-CM

## 2014-09-18 DIAGNOSIS — Z510 Encounter for antineoplastic radiation therapy: Secondary | ICD-10-CM | POA: Diagnosis not present

## 2014-09-18 NOTE — Progress Notes (Signed)
Weekly Management Note:  Site: Right pubis/left proximal femur Current Dose:  1125  cGy Projected Dose: 2250  cGy  Narrative: The patient is seen today for routine under treatment assessment. CBCT/MVCT images/port films were reviewed. The chart was reviewed.   He is without new complaints today.  He is recently comfortable.  Physical Examination:  Filed Vitals:   09/18/14 1745  BP: 144/85  Pulse: 96  Temp: 98.7 F (37.1 C)  .  Weight: 219 lb 6.4 oz (99.519 kg).  No change.  Impression: Tolerating radiation therapy well.  Plan: Continue radiation therapy as planned.

## 2014-09-18 NOTE — Progress Notes (Signed)
Mr. Brier has received 5 fractions to his right pubis and left femur.  He reports pain level of level 7/10. No constipation with pain medication.

## 2014-09-18 NOTE — Progress Notes (Signed)
Pt here for patient teaching.  Pt given Radiation and You booklet and skin care instructions. Reviewed areas of pertinence such as hair loss, skin changes and urinary and bladder changes . Pt able to give teach back of use unscented/gentle soap and have Imodium on hand,avoid applying anything to skin within 4 hours of treatment. Pt demonstrated understanding and verbalizes understanding of information given and will contact nursing with any questions or concerns.

## 2014-09-19 ENCOUNTER — Ambulatory Visit
Admission: RE | Admit: 2014-09-19 | Discharge: 2014-09-19 | Disposition: A | Discharge status: Home / Self Care | Payer: BC Managed Care – PPO | Source: Ambulatory Visit | Attending: Radiation Oncology | Admitting: Radiation Oncology

## 2014-09-19 DIAGNOSIS — Z510 Encounter for antineoplastic radiation therapy: Secondary | ICD-10-CM | POA: Diagnosis not present

## 2014-09-20 ENCOUNTER — Ambulatory Visit
Admission: RE | Admit: 2014-09-20 | Discharge: 2014-09-20 | Disposition: A | Discharge status: Home / Self Care | Payer: BC Managed Care – PPO | Source: Ambulatory Visit | Attending: Radiation Oncology | Admitting: Radiation Oncology

## 2014-09-20 ENCOUNTER — Other Ambulatory Visit: Payer: Self-pay | Admitting: Hematology and Oncology

## 2014-09-20 DIAGNOSIS — Z510 Encounter for antineoplastic radiation therapy: Secondary | ICD-10-CM | POA: Diagnosis not present

## 2014-09-21 ENCOUNTER — Ambulatory Visit
Admission: RE | Admit: 2014-09-21 | Discharge: 2014-09-21 | Disposition: A | Discharge status: Home / Self Care | Payer: BC Managed Care – PPO | Source: Ambulatory Visit | Attending: Radiation Oncology | Admitting: Radiation Oncology

## 2014-09-21 DIAGNOSIS — Z510 Encounter for antineoplastic radiation therapy: Secondary | ICD-10-CM | POA: Diagnosis not present

## 2014-09-22 ENCOUNTER — Ambulatory Visit
Admission: RE | Admit: 2014-09-22 | Discharge: 2014-09-22 | Disposition: A | Discharge status: Home / Self Care | Payer: BC Managed Care – PPO | Source: Ambulatory Visit | Attending: Radiation Oncology | Admitting: Radiation Oncology

## 2014-09-22 DIAGNOSIS — Z510 Encounter for antineoplastic radiation therapy: Secondary | ICD-10-CM | POA: Diagnosis not present

## 2014-09-23 ENCOUNTER — Encounter: Payer: Self-pay | Admitting: Radiation Oncology

## 2014-09-24 ENCOUNTER — Ambulatory Visit: Payer: BC Managed Care – PPO | Admitting: Radiation Oncology

## 2014-09-25 ENCOUNTER — Encounter: Payer: Self-pay | Admitting: Radiation Oncology

## 2014-09-25 ENCOUNTER — Ambulatory Visit
Admission: RE | Admit: 2014-09-25 | Discharge: 2014-09-25 | Disposition: A | Discharge status: Home / Self Care | Payer: BC Managed Care – PPO | Source: Ambulatory Visit | Attending: Radiation Oncology | Admitting: Radiation Oncology

## 2014-09-25 ENCOUNTER — Encounter: Payer: Self-pay | Admitting: Hematology and Oncology

## 2014-09-25 VITALS — BP 124/78 | HR 80 | Temp 98.5°F | Ht 72.0 in | Wt 219.4 lb

## 2014-09-25 DIAGNOSIS — Z510 Encounter for antineoplastic radiation therapy: Secondary | ICD-10-CM | POA: Diagnosis not present

## 2014-09-25 DIAGNOSIS — C9000 Multiple myeloma not having achieved remission: Secondary | ICD-10-CM

## 2014-09-25 NOTE — Progress Notes (Signed)
Dylan Benson has received 10 fractions to his right pubis and left hip.  He grades pain in his left hip and thigh as a level 8/10.  Walking with a limp.

## 2014-09-25 NOTE — Progress Notes (Signed)
Weekly Management Note:  Site: Right pubis, left proximal femur Current Dose:  2250  cGy Projected Dose: 2250  cGy  Narrative: The patient is seen today for routine under treatment assessment. CBCT/MVCT images/port films were reviewed. The chart was reviewed.   He twisted his left hip/femur last week while at work resulting in severe pain.  His pain is now less severe and he is able to walk with a limp.  Physical Examination:  Filed Vitals:   09/25/14 1722  BP: 124/78  Pulse: 80  Temp: 98.5 F (36.9 C)  .  Weight: 219 lb 6.4 oz (99.519 kg).  No change.  He is able to walk with a limp.  He describes discomfort along the left mid to proximal femur.  Impression: Tolerating radiation therapy well, although he apparently twisted his left leg last week.  I offered to send him upstairs for plain x-rays today but he would like to wait at least 1 week.  I gave him my voicemail number to call me if his pain is not improved by later this week or early next week.  Plan: Follow-up visit with me in one month or sooner.

## 2014-09-25 NOTE — Progress Notes (Signed)
[facility] End of Treatment Note  Name:Dylan Benson  Date: 09/25/2014 PBD:[id number] DOB:1971/03/27   Status:outpatient    CC: Jeanelle Malling, PA-C  Dr. Heath Lark  REFERRING PHYSICIAN:   Dr. Heath Lark    DIAGNOSIS:   Multiple myeloma   INDICATION FOR TREATMENT: Palliative   TREATMENT DATES:  09/12/2014 through 09/26/2014                           SITE/DOSE:  Right pubis, and left femur 2250 cGy in 10 sessions                            BEAMS/ENERGY:   AP  PA fields to both sites with 15 MV photons to the right pubis and 10 MV photons to his left proximal to mid femur.                NARRATIVE:  The patient tolerated his treatment well, however during his second week of treatment he twisted his hip while at work and had severe pain.  His pain is improved and he is now weightbearing, but he walks with a significant limp.  I offered to have him have plain films of his left femur, but he would like to wait until later this week or early next week if his pain does not continue to improve.                          PLAN: Routine followup in one month. Patient instructed to call if questions or worsening complaints in interim.

## 2014-09-29 ENCOUNTER — Telehealth: Payer: Self-pay | Admitting: *Deleted

## 2014-09-29 NOTE — Telephone Encounter (Signed)
The patient is medically ready for transplant and I encouraged him to pursue that. I am not aware of any social issues regarding the reason whether he should have transplant or not

## 2014-09-29 NOTE — Telephone Encounter (Signed)
Received call from Wells Guiles at [facility] asking,"we received an email from the patient's wife stating that he was ready to start the stem cell transplant process. Wells Guiles wants to know if that is more "wife driven" or is patient really ready?" Patient sees Dr. Alvy Bimler on 10/12/14. Wells Guiles is going to wait until after that scheduled visit and let Dr. Alvy Bimler decide. If Dr. Alvy Bimler thinks patient is ready before the scheduled visit, please call her at [phone] and let her know.

## 2014-10-01 ENCOUNTER — Encounter: Payer: Self-pay | Admitting: Hematology and Oncology

## 2014-10-02 ENCOUNTER — Encounter: Payer: Self-pay | Admitting: Hematology and Oncology

## 2014-10-02 ENCOUNTER — Telehealth: Payer: Self-pay | Admitting: Hematology and Oncology

## 2014-10-02 ENCOUNTER — Other Ambulatory Visit: Payer: Self-pay | Admitting: Hematology and Oncology

## 2014-10-02 ENCOUNTER — Telehealth: Payer: Self-pay | Admitting: *Deleted

## 2014-10-02 ENCOUNTER — Other Ambulatory Visit (HOSPITAL_BASED_OUTPATIENT_CLINIC_OR_DEPARTMENT_OTHER): Payer: BC Managed Care – PPO

## 2014-10-02 ENCOUNTER — Ambulatory Visit (HOSPITAL_BASED_OUTPATIENT_CLINIC_OR_DEPARTMENT_OTHER): Payer: BC Managed Care – PPO | Admitting: Hematology and Oncology

## 2014-10-02 VITALS — BP 115/79 | HR 95 | Temp 98.2°F | Resp 18 | Ht 72.0 in | Wt 218.5 lb

## 2014-10-02 DIAGNOSIS — R112 Nausea with vomiting, unspecified: Secondary | ICD-10-CM | POA: Insufficient documentation

## 2014-10-02 DIAGNOSIS — C7951 Secondary malignant neoplasm of bone: Secondary | ICD-10-CM

## 2014-10-02 DIAGNOSIS — G4700 Insomnia, unspecified: Secondary | ICD-10-CM | POA: Insufficient documentation

## 2014-10-02 DIAGNOSIS — C9000 Multiple myeloma not having achieved remission: Secondary | ICD-10-CM | POA: Diagnosis not present

## 2014-10-02 DIAGNOSIS — M899 Disorder of bone, unspecified: Secondary | ICD-10-CM

## 2014-10-02 DIAGNOSIS — R197 Diarrhea, unspecified: Secondary | ICD-10-CM | POA: Insufficient documentation

## 2014-10-02 DIAGNOSIS — R1115 Cyclical vomiting syndrome unrelated to migraine: Secondary | ICD-10-CM

## 2014-10-02 LAB — CBC WITH DIFFERENTIAL/PLATELET
BASO%: 1.5 % (ref 0.0–2.0)
Basophils Absolute: 0.1 10*3/uL (ref 0.0–0.1)
EOS ABS: 0.4 10*3/uL (ref 0.0–0.5)
EOS%: 5.9 % (ref 0.0–7.0)
HEMATOCRIT: 44.1 % (ref 38.4–49.9)
HGB: 14.8 g/dL (ref 13.0–17.1)
LYMPH%: 18 % (ref 14.0–49.0)
MCH: 27.9 pg (ref 27.2–33.4)
MCHC: 33.6 g/dL (ref 32.0–36.0)
MCV: 83 fL (ref 79.3–98.0)
MONO#: 0.5 10*3/uL (ref 0.1–0.9)
MONO%: 7.3 % (ref 0.0–14.0)
NEUT#: 4.5 10*3/uL (ref 1.5–6.5)
NEUT%: 67.3 % (ref 39.0–75.0)
Platelets: 260 10*3/uL (ref 140–400)
RBC: 5.31 10*6/uL (ref 4.20–5.82)
RDW: 15.6 % — AB (ref 11.0–14.6)
WBC: 6.7 10*3/uL (ref 4.0–10.3)
lymph#: 1.2 10*3/uL (ref 0.9–3.3)

## 2014-10-02 LAB — COMPREHENSIVE METABOLIC PANEL (CC13)
ALK PHOS: 74 U/L (ref 40–150)
ALT: 46 U/L (ref 0–55)
ANION GAP: 11 meq/L (ref 3–11)
AST: 43 U/L — ABNORMAL HIGH (ref 5–34)
Albumin: 3.9 g/dL (ref 3.5–5.0)
BUN: 10.1 mg/dL (ref 7.0–26.0)
CO2: 22 mEq/L (ref 22–29)
Calcium: 9.6 mg/dL (ref 8.4–10.4)
Chloride: 104 mEq/L (ref 98–109)
Creatinine: 0.8 mg/dL (ref 0.7–1.3)
EGFR: >90 mL/min/{1.73_m2} (ref >90)
Glucose: 104 mg/dl (ref 70–140)
Potassium: 4.2 mEq/L (ref 3.5–5.1)
Sodium: 137 mEq/L (ref 136–145)
TOTAL PROTEIN: 7.4 g/dL (ref 6.4–8.3)
Total Bilirubin: 0.48 mg/dL (ref 0.20–1.20)

## 2014-10-02 MED ORDER — TEMAZEPAM 15 MG PO CAPS: 15.0000 mg | ORAL_CAPSULE | Freq: Every evening | ORAL | Status: DC | PRN

## 2014-10-02 MED ORDER — FENTANYL 25 MCG/HR TD PT72: 25.0000 ug | MEDICATED_PATCH | TRANSDERMAL | Status: DC

## 2014-10-02 MED ORDER — ONDANSETRON HCL 8 MG PO TABS: 8.0000 mg | ORAL_TABLET | Freq: Three times a day (TID) | ORAL | Status: AC | PRN

## 2014-10-02 MED ORDER — HYDROMORPHONE HCL 4 MG PO TABS: 4.0000 mg | ORAL_TABLET | Freq: Four times a day (QID) | ORAL | Status: DC | PRN

## 2014-10-02 NOTE — Assessment & Plan Note (Signed)
This is not related to treatment. It could be related to gastroenteritis. He is taking Compazine. I went ahead and prescribe Zofran as needed.

## 2014-10-02 NOTE — Assessment & Plan Note (Signed)
I noted he have multiple prescriptions of benzos ice pain. He stated he's been taking lorazepam as needed at nighttime for sleep and not from anxiety or depression. I plan to switch him to temazepam for now with the future plan of weaning him off anxiolytics

## 2014-10-02 NOTE — Telephone Encounter (Signed)
Instructed pt per Dr. Alvy Bimler to come in for lab and MD this morning.   Instructed him to come in as soon as he can.  He states will be here in about one hour.

## 2014-10-02 NOTE — Telephone Encounter (Signed)
Pt's wife sent email yesterday reporting pt has "severe nausea" not relieved w/ Compazine.  Called pt this morning.  He reports started having nausea and diarrhea on Saturday with aprox 3 diarrhea stools per day and "sick to my stomach."   Pt states he can't eat due to nausea and when he does eat it "goes right through me."  He denies any vomiting.  He denies any fevers.  He has been taking pepto-bismol for the diarrhea and compazine for nausea w/o any relief.

## 2014-10-02 NOTE — Progress Notes (Signed)
[place] OFFICE PROGRESS NOTE  Patient Care Team: Jeanelle Malling, PA-C as PCP - General  SUMMARY OF ONCOLOGIC HISTORY:   Multiple myeloma   03/16/2014 Imaging Skeletal survey showed Multiple osseous lesions as as above supporting diagnosis of multiple myeloma   03/22/2014 Bone Marrow Biopsy Accession: [id number] bone marrow biopsy, from multiple myeloma.   04/10/2014 - 06/26/2014 Chemotherapy He received induction chemotherapy with Revlimid, Velcade and dexamethasone.   06/12/2014 - 07/10/2014 Chemotherapy He received maintenance treatment with Revlimid alone. Treatment was held in anticipation for surgery.   07/10/2014 Imaging CT scan and MRI show significant compression fractures and impeding fracture in the left femoral neck.   07/27/2014 - 07/28/2014 Hospital Admission  the patient was admitted to the hospital for elective left hip surgery.   07/27/2014 Surgery He underwent prophylactic cephalomedullary nail placement, left femur.   09/12/2014 - 09/26/2014 Radiation Therapy He received radiation to AP PA fields to both sites with 15 MV photons to the right pubis and 10 MV photons to his left proximal to mid femur.     INTERVAL HISTORY: Please see below for problem oriented charting. He is seen urgently today because of uncontrolled nausea, vomiting and not feeling well for 2 days. It started last Friday with nausea and vomiting. He has 5 times of diarrhea yesterday and twice today. He is able to tolerate some oral liquids. Denies increasing pain. No recent fevers or chills.  REVIEW OF SYSTEMS:   Constitutional: Denies fevers, chills or abnormal weight loss Eyes: Denies blurriness of vision Ears, nose, mouth, throat, and face: Denies mucositis or sore throat Respiratory: Denies cough, dyspnea or wheezes Cardiovascular: Denies palpitation, chest discomfort or lower extremity swelling Skin: Denies abnormal skin rashes Lymphatics: Denies new lymphadenopathy or  easy bruising Neurological:Denies numbness, tingling or new weaknesses Behavioral/Psych: Mood is stable, no new changes  All other systems were reviewed with the patient and are negative.  I have reviewed the past medical history, past surgical history, social history and family history with the patient and they are unchanged from previous note.  ALLERGIES:  is allergic to aleve and zofran.  MEDICATIONS:  Current Outpatient Prescriptions  Medication Sig Dispense Refill  . acyclovir (ZOVIRAX) 400 MG tablet Take 1 tablet (400 mg total) by mouth 2 (two) times daily. 60 tablet 2  . aspirin 325 MG tablet Take 325 mg by mouth daily.    . diazepam (VALIUM) 5 MG tablet Take 1 tablet (5 mg total) by mouth at bedtime. 30 tablet 1  . fentaNYL (DURAGESIC - DOSED MCG/HR) 25 MCG/HR patch Place 1 patch (25 mcg total) onto the skin every 3 (three) days. 10 patch 0  . gabapentin (NEURONTIN) 600 MG tablet Take 1 tablet (600 mg total) by mouth 3 (three) times daily. 90 tablet 3  . HYDROmorphone (DILAUDID) 4 MG tablet Take 1 tablet (4 mg total) by mouth every 6 (six) hours as needed for severe pain. 90 tablet 0  . insulin aspart protamine - aspart (NOVOLOG 70/30 MIX) (70-30) 100 UNIT/ML FlexPen Inject 17 units before breakfast;  14 units before supper for DM Type 2    . LORazepam (ATIVAN) 0.5 MG tablet TAKE (1) TABLET BY MOUTH EVERY 6 HOURS AS NEEDED FOR ANXIETY. 60 tablet 0  . metFORMIN (GLUCOPHAGE) 500 MG tablet Take 1,000 mg by mouth 2 (two) times daily with a meal.    . prochlorperazine (COMPAZINE) 10 MG tablet TAKE ONE TABLET BY MOUTH EVERY 6 HOURS AS NEEDED FOR NAUSEA &  VOMITING 30 tablet 6  . ranitidine (ZANTAC) 150 MG tablet Take 300 mg by mouth daily as needed for heartburn.     . ondansetron (ZOFRAN) 8 MG tablet Take 1 tablet (8 mg total) by mouth every 8 (eight) hours as needed for nausea. 60 tablet 3  . temazepam (RESTORIL) 15 MG capsule Take 1 capsule (15 mg total) by mouth at bedtime as needed for  sleep. 30 capsule 0   No current facility-administered medications for this visit.    PHYSICAL EXAMINATION: ECOG PERFORMANCE STATUS: 1 - Symptomatic but completely ambulatory  Filed Vitals:   10/02/14 1028  BP: 115/79  Pulse: 95  Temp: 98.2 F (36.8 C)  Resp: 18   Filed Weights   10/02/14 1028  Weight: 218 lb 8 oz (99.111 kg)    GENERAL:alert, no distress and comfortable SKIN: skin color, texture, turgor are normal, no rashes or significant lesions EYES: normal, Conjunctiva are pink and non-injected, sclera clear OROPHARYNX:no exudate, no erythema and lips, buccal mucosa, and tongue normal  NECK: supple, thyroid normal size, non-tender, without nodularity LYMPH:  no palpable lymphadenopathy in the cervical, axillary or inguinal LUNGS: clear to auscultation and percussion with normal breathing effort HEART: regular rate & rhythm and no murmurs and no lower extremity edema ABDOMEN:abdomen soft, non-tender and normal bowel sounds Musculoskeletal:no cyanosis of digits and no clubbing  NEURO: alert & oriented x 3 with fluent speech, no focal motor/sensory deficits  LABORATORY DATA:  I have reviewed the data as listed    Component Value Date/Time   NA 137 10/02/2014 1014   NA 133* 07/28/2014 0500   K 4.2 10/02/2014 1014   K 3.9 07/28/2014 0500   CL 99 07/28/2014 0500   CO2 22 10/02/2014 1014   CO2 23 07/28/2014 0500   GLUCOSE 104 10/02/2014 1014   GLUCOSE 223* 07/28/2014 0500   BUN 10.1 10/02/2014 1014   BUN 9 07/28/2014 0500   CREATININE 0.8 10/02/2014 1014   CREATININE 0.78 07/28/2014 0500   CALCIUM 9.6 10/02/2014 1014   CALCIUM 8.8 07/28/2014 0500   PROT 7.4 10/02/2014 1014   PROT 6.5 07/09/2014 1528   ALBUMIN 3.9 10/02/2014 1014   ALBUMIN 3.7 07/09/2014 1528   AST 43* 10/02/2014 1014   AST 41* 07/09/2014 1528   ALT 46 10/02/2014 1014   ALT 35 07/09/2014 1528   ALKPHOS 74 10/02/2014 1014   ALKPHOS 75 07/09/2014 1528   BILITOT 0.48 10/02/2014 1014   BILITOT  0.6 07/09/2014 1528   GFRNONAA >90 07/28/2014 0500   GFRAA >90 07/28/2014 0500    No results found for: SPEP, UPEP  Lab Results  Component Value Date   WBC 6.7 10/02/2014   NEUTROABS 4.5 10/02/2014   HGB 14.8 10/02/2014   HCT 44.1 10/02/2014   MCV 83.0 10/02/2014   PLT 260 10/02/2014      Chemistry      Component Value Date/Time   NA 137 10/02/2014 1014   NA 133* 07/28/2014 0500   K 4.2 10/02/2014 1014   K 3.9 07/28/2014 0500   CL 99 07/28/2014 0500   CO2 22 10/02/2014 1014   CO2 23 07/28/2014 0500   BUN 10.1 10/02/2014 1014   BUN 9 07/28/2014 0500   CREATININE 0.8 10/02/2014 1014   CREATININE 0.78 07/28/2014 0500      Component Value Date/Time   CALCIUM 9.6 10/02/2014 1014   CALCIUM 8.8 07/28/2014 0500   ALKPHOS 74 10/02/2014 1014   ALKPHOS 75 07/09/2014 1528   AST  43* 10/02/2014 1014   AST 41* 07/09/2014 1528   ALT 46 10/02/2014 1014   ALT 35 07/09/2014 1528   BILITOT 0.48 10/02/2014 1014   BILITOT 0.6 07/09/2014 1528      ASSESSMENT & PLAN:  Multiple myeloma I will continue to hold treatment. He had completed radiation treatment. He is in agreement to pursue bone marrow transplant and has contacted [facility] for this. I will see him back in the month for supportive care visit.    Bone metastases Patient has known bone metastasis to both the thoracic and lumbar region; as well as multiple ribs.  Patient is currently undergoing Zometa infusions on an every three-month basis.  His last Zometa infusion was 06/19/2014.  He'll be due for his next Zometa infusion in mid April 2016. I plan to hold that appointment due to ongoing radiation treatment. I refill on his prescription for pain today. Once he completes all treatment, I plan to start weaning him off his prescription pain medicine     Diarrhea He had recent diarrhea of unknown etiology. Clinically, he looks well without signs of dehydration. Recommend he takes Imodium as needed.   Vomiting with  nausea, not intractable This is not related to treatment. It could be related to gastroenteritis. He is taking Compazine. I went ahead and prescribe Zofran as needed.   Insomnia I noted he have multiple prescriptions of benzos ice pain. He stated he's been taking lorazepam as needed at nighttime for sleep and not from anxiety or depression. I plan to switch him to temazepam for now with the future plan of weaning him off anxiolytics    No orders of the defined types were placed in this encounter.   All questions were answered. The patient knows to call the clinic with any problems, questions or concerns. No barriers to learning was detected. I spent 25 minutes counseling the patient face to face. The total time spent in the appointment was 30 minutes and more than 50% was on counseling and review of test results     [facility], [place], MD 10/02/2014 4:30 PM

## 2014-10-02 NOTE — Assessment & Plan Note (Signed)
He had recent diarrhea of unknown etiology. Clinically, he looks well without signs of dehydration. Recommend he takes Imodium as needed.

## 2014-10-02 NOTE — Assessment & Plan Note (Signed)
Patient has known bone metastasis to both the thoracic and lumbar region; as well as multiple ribs.  Patient is currently undergoing Zometa infusions on an every three-month basis.  His last Zometa infusion was 06/19/2014.  He'll be due for his next Zometa infusion in mid April 2016. I plan to hold that appointment due to ongoing radiation treatment. I refill on his prescription for pain today. Once he completes all treatment, I plan to start weaning him off his prescription pain medicine

## 2014-10-02 NOTE — Assessment & Plan Note (Signed)
I will continue to hold treatment. He had completed radiation treatment. He is in agreement to pursue bone marrow transplant and has contacted [facility] for this. I will see him back in the month for supportive care visit.

## 2014-10-02 NOTE — Telephone Encounter (Signed)
Gave patient avs report and appointment for June.  °

## 2014-10-04 ENCOUNTER — Encounter: Payer: Self-pay | Admitting: Hematology and Oncology

## 2014-10-05 ENCOUNTER — Other Ambulatory Visit: Payer: Self-pay | Admitting: *Deleted

## 2014-10-05 ENCOUNTER — Encounter: Payer: Self-pay | Admitting: *Deleted

## 2014-10-05 DIAGNOSIS — C7951 Secondary malignant neoplasm of bone: Secondary | ICD-10-CM

## 2014-10-05 DIAGNOSIS — C9000 Multiple myeloma not having achieved remission: Secondary | ICD-10-CM

## 2014-10-05 MED ORDER — VALACYCLOVIR HCL 1 G PO TABS
ORAL_TABLET | ORAL | Status: AC
Start: 2014-10-05 — End: ?

## 2014-10-05 NOTE — Progress Notes (Signed)
Note from Urban Gibson, Transplant Coordinator at [facility].  Autologous stem cell transplant scheduled.  Collection scheduled for 11/07/14 and Admission date for transplant is 11/13/14 and Transplant on 11/14/14.    Dr. Alvy Bimler notified and MD appt w/ Dr. Lurlean Leyden on 6/7 canceled.

## 2014-10-12 ENCOUNTER — Ambulatory Visit: Payer: BC Managed Care – PPO | Admitting: Hematology and Oncology

## 2014-10-19 ENCOUNTER — Telehealth: Payer: Self-pay | Admitting: *Deleted

## 2014-10-19 NOTE — Telephone Encounter (Signed)
Call from Transplant Coordinator, Dylan Benson,  At [facility].  States pt needs repeat EKG as part of work up for Transplant.  Requests it be done on Monday 5/23, but she will call back as she wants to inform pt first.

## 2014-10-25 ENCOUNTER — Encounter: Payer: Self-pay | Admitting: Radiation Oncology

## 2014-10-25 ENCOUNTER — Encounter: Payer: Self-pay | Admitting: Hematology and Oncology

## 2014-10-31 ENCOUNTER — Other Ambulatory Visit: Payer: Self-pay | Admitting: Hematology and Oncology

## 2014-11-03 ENCOUNTER — Other Ambulatory Visit: Payer: Self-pay | Admitting: Hematology and Oncology

## 2014-11-03 DIAGNOSIS — G4700 Insomnia, unspecified: Secondary | ICD-10-CM

## 2014-11-03 MED ORDER — TEMAZEPAM 15 MG PO CAPS: 15.0000 mg | ORAL_CAPSULE | Freq: Every evening | ORAL | Status: DC | PRN

## 2014-11-05 ENCOUNTER — Other Ambulatory Visit: Payer: Self-pay | Admitting: Hematology and Oncology

## 2014-11-06 ENCOUNTER — Other Ambulatory Visit: Payer: Self-pay | Admitting: Hematology and Oncology

## 2014-11-06 ENCOUNTER — Telehealth: Payer: Self-pay | Admitting: *Deleted

## 2014-11-06 DIAGNOSIS — C9000 Multiple myeloma not having achieved remission: Secondary | ICD-10-CM

## 2014-11-06 DIAGNOSIS — M899 Disorder of bone, unspecified: Secondary | ICD-10-CM

## 2014-11-06 MED ORDER — HYDROMORPHONE HCL 4 MG PO TABS: 4.0000 mg | ORAL_TABLET | Freq: Four times a day (QID) | ORAL | Status: DC | PRN

## 2014-11-06 MED ORDER — FENTANYL 25 MCG/HR TD PT72: 25.0000 ug | MEDICATED_PATCH | TRANSDERMAL | Status: DC

## 2014-11-06 NOTE — Telephone Encounter (Signed)
Informed pt Rxs pain medication are ready to pick up.  He verbalized understanding.

## 2014-11-07 ENCOUNTER — Ambulatory Visit: Payer: BC Managed Care – PPO | Admitting: Radiation Oncology

## 2014-11-07 ENCOUNTER — Ambulatory Visit: Payer: BC Managed Care – PPO | Admitting: Hematology and Oncology

## 2014-11-29 ENCOUNTER — Other Ambulatory Visit: Payer: Self-pay | Admitting: Hematology and Oncology

## 2014-11-29 DIAGNOSIS — G4700 Insomnia, unspecified: Secondary | ICD-10-CM

## 2014-12-06 ENCOUNTER — Other Ambulatory Visit: Payer: Self-pay | Admitting: Hematology and Oncology

## 2014-12-06 DIAGNOSIS — G4700 Insomnia, unspecified: Secondary | ICD-10-CM

## 2014-12-06 MED ORDER — TEMAZEPAM 15 MG PO CAPS: 15.0000 mg | ORAL_CAPSULE | Freq: Every evening | ORAL | Status: AC | PRN

## 2014-12-06 NOTE — Addendum Note (Signed)
Addended byAlvy Bimler, Cammi Consalvo on: 12/06/2014 09:05 AM   Modules accepted: Orders

## 2014-12-06 NOTE — Telephone Encounter (Signed)
Prescription ready for pick up.

## 2014-12-25 ENCOUNTER — Other Ambulatory Visit: Payer: Self-pay | Admitting: Hematology and Oncology

## 2014-12-27 ENCOUNTER — Other Ambulatory Visit: Payer: Self-pay | Admitting: Hematology and Oncology

## 2014-12-27 ENCOUNTER — Encounter: Payer: Self-pay | Admitting: Hematology and Oncology

## 2014-12-27 DIAGNOSIS — M899 Disorder of bone, unspecified: Secondary | ICD-10-CM

## 2014-12-27 DIAGNOSIS — C9000 Multiple myeloma not having achieved remission: Secondary | ICD-10-CM

## 2014-12-27 MED ORDER — FENTANYL 25 MCG/HR TD PT72: 25.0000 ug | MEDICATED_PATCH | TRANSDERMAL | Status: DC

## 2015-01-03 ENCOUNTER — Other Ambulatory Visit: Payer: Self-pay | Admitting: Hematology and Oncology

## 2015-01-04 ENCOUNTER — Telehealth: Payer: Self-pay | Admitting: *Deleted

## 2015-01-04 ENCOUNTER — Other Ambulatory Visit: Payer: Self-pay | Admitting: *Deleted

## 2015-01-04 DIAGNOSIS — M899 Disorder of bone, unspecified: Secondary | ICD-10-CM

## 2015-01-04 MED ORDER — LORAZEPAM 0.5 MG PO TABS: ORAL_TABLET | ORAL | Status: DC

## 2015-01-04 MED ORDER — HYDROMORPHONE HCL 4 MG PO TABS: 4.0000 mg | ORAL_TABLET | Freq: Four times a day (QID) | ORAL | Status: DC | PRN

## 2015-01-04 NOTE — Telephone Encounter (Signed)
Informed pt of rx for dilaudid and ativan are ready to pick up. He verbalized understanding.

## 2015-01-04 NOTE — Telephone Encounter (Signed)
Today this patient chart in my "open encounter folder".  This nurse has no been in this chart.  Called patient to confirm the medication renewal request.  Confirms request.  "My wife sent this yesterday evening."  Denies any problems in sending request.  Called collaborative nurse and routed request to Dr. Alvy Bimler.

## 2015-01-08 ENCOUNTER — Encounter: Payer: Self-pay | Admitting: *Deleted

## 2015-01-08 ENCOUNTER — Other Ambulatory Visit: Payer: Self-pay | Admitting: Hematology and Oncology

## 2015-01-08 ENCOUNTER — Encounter: Payer: Self-pay | Admitting: Hematology and Oncology

## 2015-01-08 NOTE — Telephone Encounter (Signed)
Call received from Dylan Benson's wife asking "if full body pet scan can be performed at [facility].  They found a new growth that showed up on xray.  Currently scheduled at [facility] on 01-15-2015.  Observed this documentation.  Informed her there is a response to patient portal message they haven't read yet.  This nurse read the MyChart response and they thanked me for letting them know.

## 2015-01-28 ENCOUNTER — Other Ambulatory Visit: Payer: Self-pay | Admitting: Hematology and Oncology

## 2015-01-29 ENCOUNTER — Other Ambulatory Visit: Payer: Self-pay | Admitting: Hematology and Oncology

## 2015-02-01 ENCOUNTER — Telehealth: Payer: Self-pay | Admitting: *Deleted

## 2015-02-01 NOTE — Telephone Encounter (Signed)
Pt requesting refill on Fentanyl Patch and Dilaudid.  States is almost out.  Pain is unchanged. He goes back to [facility] on 9/15 for BMBx and "more tests."

## 2015-02-01 NOTE — Telephone Encounter (Signed)
VOICE MAIL AT 3:01PM/ FORWARD AT 3:40PM PT.'S WIFE REQUESTING REFILLS ON PT.'S DURAGESIC AND DILAUDID. DR.GORSUCH'S NURSE,CAMEO WINDHAM,RN IS AWARE.

## 2015-02-02 ENCOUNTER — Other Ambulatory Visit: Payer: Self-pay | Admitting: Hematology and Oncology

## 2015-02-02 ENCOUNTER — Telehealth: Payer: Self-pay | Admitting: *Deleted

## 2015-02-02 DIAGNOSIS — M899 Disorder of bone, unspecified: Secondary | ICD-10-CM

## 2015-02-02 DIAGNOSIS — C9000 Multiple myeloma not having achieved remission: Secondary | ICD-10-CM

## 2015-02-02 MED ORDER — FENTANYL 25 MCG/HR TD PT72: 25.0000 ug | MEDICATED_PATCH | TRANSDERMAL | Status: DC

## 2015-02-02 MED ORDER — HYDROMORPHONE HCL 4 MG PO TABS: 4.0000 mg | ORAL_TABLET | Freq: Four times a day (QID) | ORAL | Status: DC | PRN

## 2015-02-02 NOTE — Telephone Encounter (Signed)
Informed pt of Rx for fentanyl patch and dilaudid ready to pick up.  He verbalized understanding.

## 2015-02-25 ENCOUNTER — Other Ambulatory Visit: Payer: Self-pay | Admitting: Hematology and Oncology

## 2015-02-27 ENCOUNTER — Telehealth: Payer: Self-pay | Admitting: *Deleted

## 2015-02-27 DIAGNOSIS — C9000 Multiple myeloma not having achieved remission: Secondary | ICD-10-CM

## 2015-02-27 DIAGNOSIS — M899 Disorder of bone, unspecified: Secondary | ICD-10-CM

## 2015-02-27 MED ORDER — LORAZEPAM 0.5 MG PO TABS: ORAL_TABLET | ORAL | Status: DC

## 2015-02-27 MED ORDER — HYDROMORPHONE HCL 4 MG PO TABS: 4.0000 mg | ORAL_TABLET | Freq: Four times a day (QID) | ORAL | Status: AC | PRN

## 2015-02-27 MED ORDER — FENTANYL 25 MCG/HR TD PT72: 25.0000 ug | MEDICATED_PATCH | TRANSDERMAL | Status: AC

## 2015-02-27 MED ORDER — LORAZEPAM 0.5 MG PO TABS: ORAL_TABLET | ORAL | Status: AC

## 2015-02-27 MED ORDER — GABAPENTIN 600 MG PO TABS: ORAL_TABLET | ORAL | Status: DC

## 2015-02-27 MED ORDER — GABAPENTIN 600 MG PO TABS: ORAL_TABLET | ORAL | Status: AC

## 2015-02-27 NOTE — Telephone Encounter (Signed)
Pt sent refill request for Dilaudid, Ativan, Duragesic and Neurontin.   Per Dr. Alvy Bimler,  Pt is being treated and followed at [facility].  We have not seen pt in several months and may not see him again for a few more months.  Dr. Alvy Bimler will refill pain/anxiety meds for one more month and then pt is to request Refills from Dr. Cassell Clement at [facility].  I called [facility] and s/w RN, Colletta Maryland, for Dr. Cassell Clement.   Colletta Maryland will let Dr. Cassell Clement know that we have asked pt to have pain/anxiety meds managed by her starting on next months refills.   We will give pt one more refill on above meds.  Informed pt of rxs ready to pick up.  Instructed pt this it the last refill from Dr. Alvy Bimler.  Pt will need to request from Dr. Cassell Clement next month.  Pt verbalized understanding/agreement w/ plan.

## 2015-05-31 ENCOUNTER — Other Ambulatory Visit: Payer: Self-pay | Admitting: Hematology and Oncology

## 2016-01-26 IMAGING — CT CT BIOPSY
1 of 11 series · 10 of 32 positions shown, 16 images · non-contrast
Comparison: none

CLINICAL DATA: 43-year-old male with multiple lucent bone lesions
and concern for multiple myeloma. Request for bone marrow biopsy and
biopsy of a lesion if possible.

[Series 2: bonemarrowbx · axial · 0.74mm/px · z∈[-142,-42]mm · 10 of 26 slices shown, 16 images]
[im 3/26  soft-tissue]
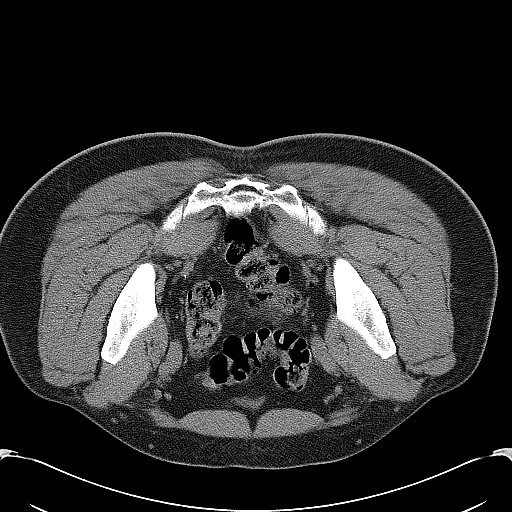
[im 3/26  bone]
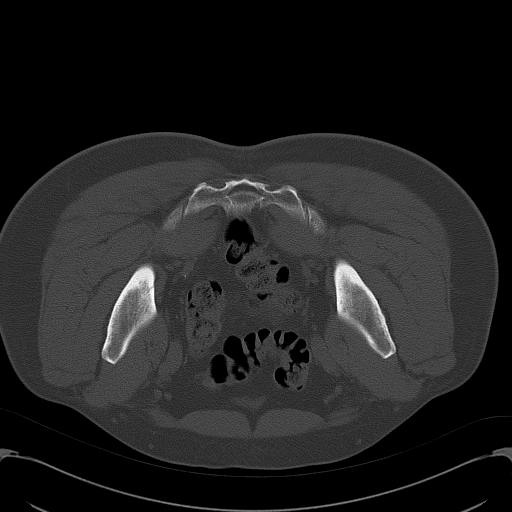
[im 5/26  soft-tissue]
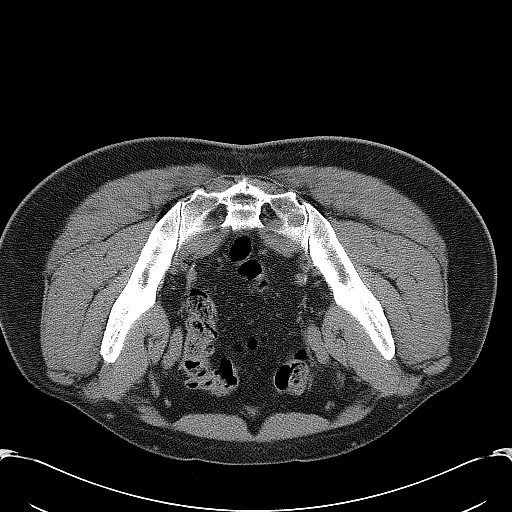
[im 7/26  soft-tissue]
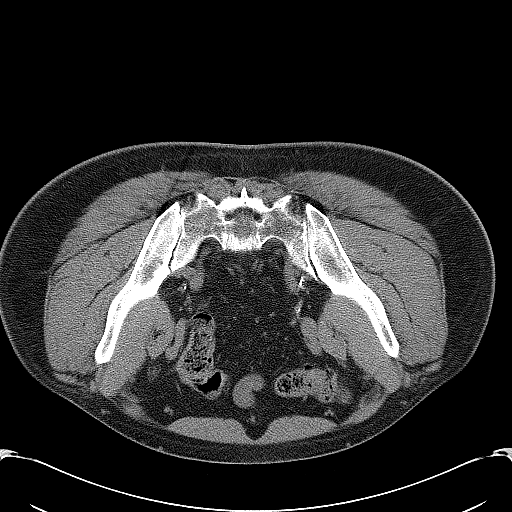
[im 10/26  soft-tissue]
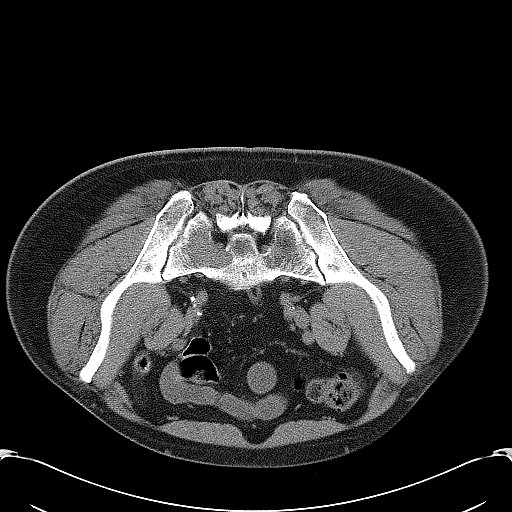
[im 12/26  soft-tissue]
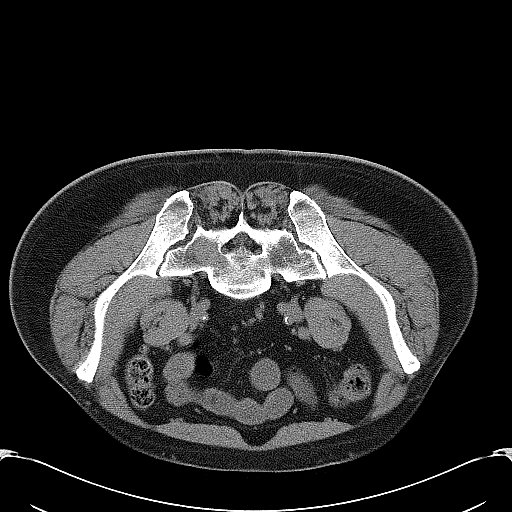
[im 14/26  soft-tissue]
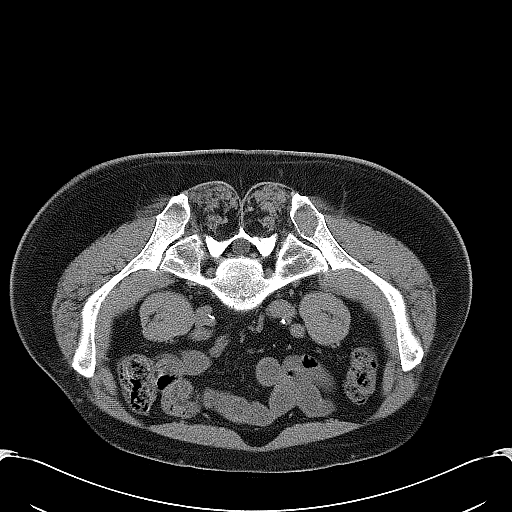
[im 16/26  soft-tissue]
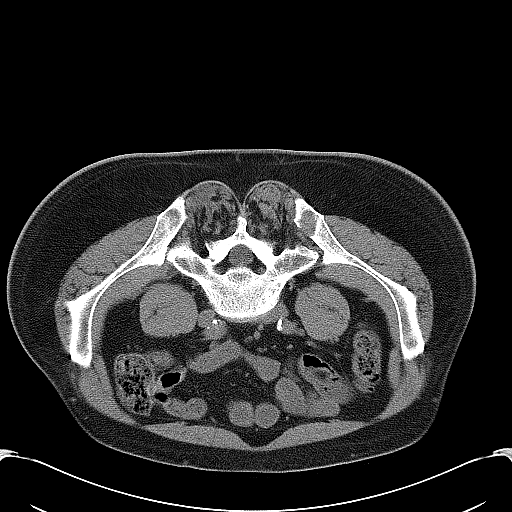
[im 16/26  lung]
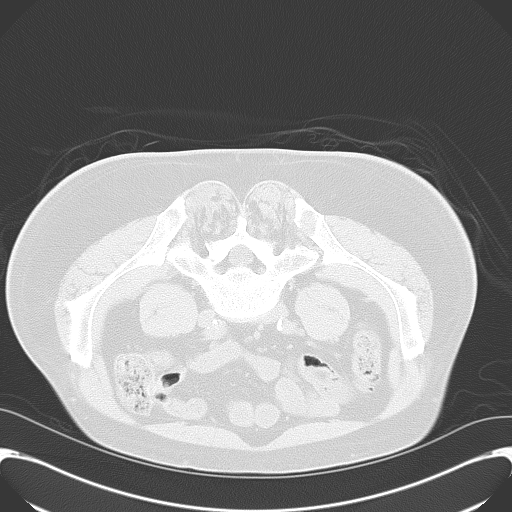
[im 19/26  soft-tissue]
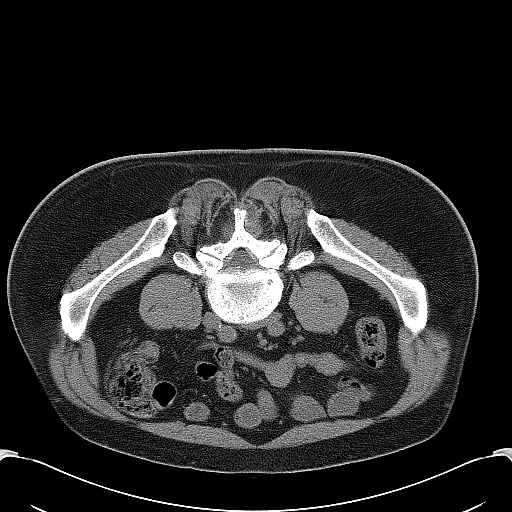
[im 19/26  lung]
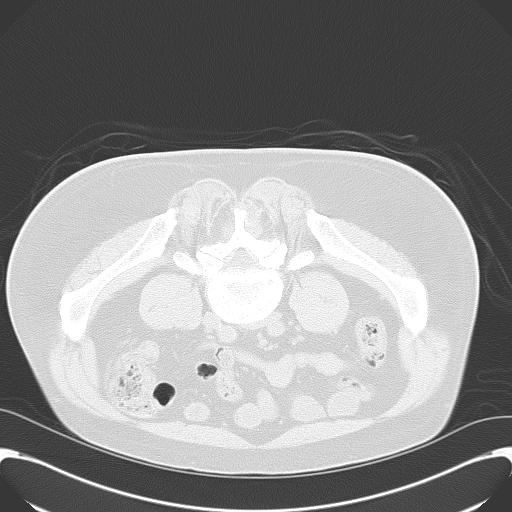
[im 21/26  soft-tissue]
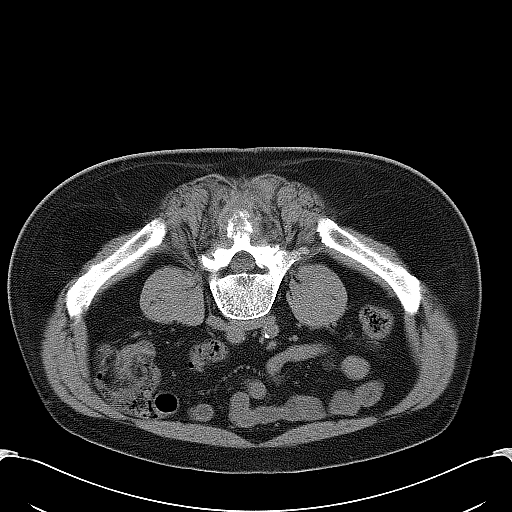
[im 21/26  lung]
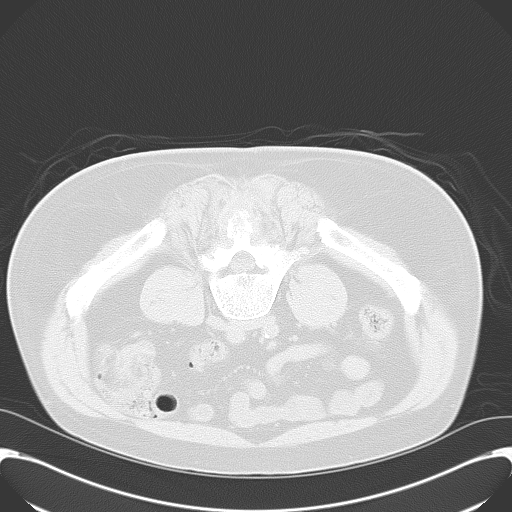
[im 21/26  bone]
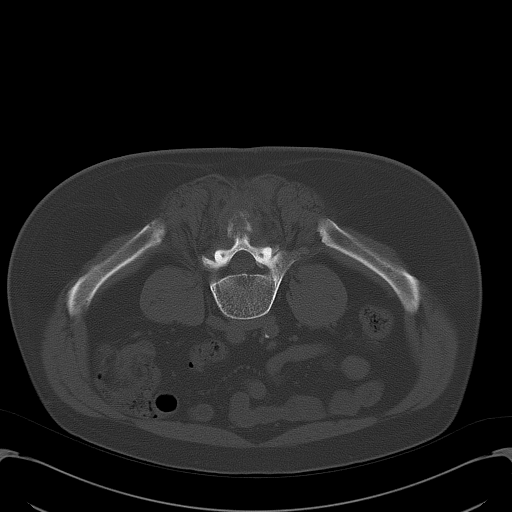
[im 23/26  soft-tissue]
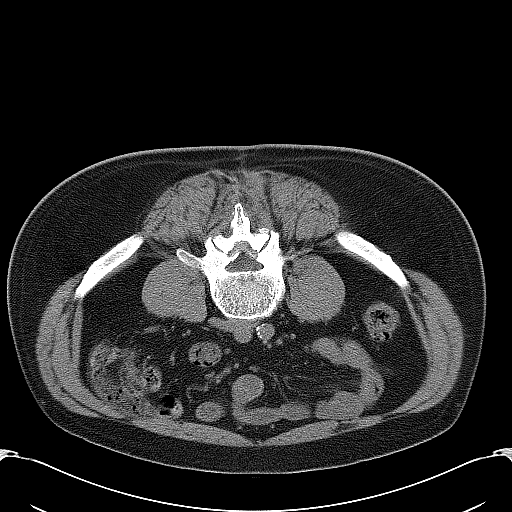
[im 23/26  lung]
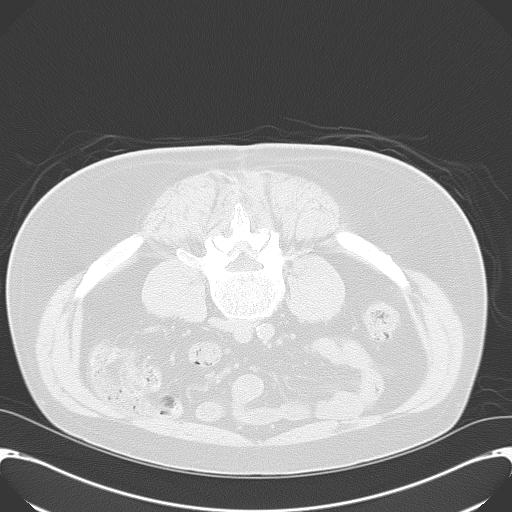

[10 of 32 positions shown; findings below may reference images not displayed]

EXAM:
CT GUIDED BONE MARROW ASPIRATES AND BIOPSY; CT GUIDED PELVIC BONE
LESION BIOPSY

MEDICATIONS:
4 mg Versed, 200 mcg fentanyl. A radiology nurse monitored the
patient for moderate sedation.

ANESTHESIA/SEDATION:
Sedation time: 35 min

PROCEDURE:
The procedure was explained to the patient. We discussed the
possibility of performing a bone marrow biopsy and a bone lesion
biopsy. The risks and benefits of the procedure were discussed and
the patient's questions were addressed. Informed consent was
obtained from the patient. The patient was placed prone on CT scan.
Images of the pelvis were obtained. The back was prepped and draped
in sterile fashion. The skin and right posterior iliac bone were
anesthetized with 1% lidocaine. 11 gauge bone needle was directed
into the right iliac bone with CT guidance. Two aspirates and one
core biopsy obtained.

Attention was directed to the posterior left iliac bone lesion. The
soft tissues and bone cortex were anesthetized with 1% lidocaine.
The 11 gauge bone needle was directed into the posterior left iliac
bone and the needle was placed within the lucent lesion. Small core
biopsy was obtained. A large aspirate was obtained from the lucent
lesion. Needle was removed without complication.
FINDINGS: 2.5 cm lucent lesion involving the posterior left iliac bone. There
is some cortical destruction along the medial aspect of this lesion.
Minimal core biopsy material was obtained from the lucent lesion. A
large aspirate was obtained from the center of this lesion. Suitable
aspirates and core biopsy were obtained from the right iliac bone.

COMPLICATIONS:
None
IMPRESSION: CT guided bone marrow aspirates and core biopsy.

CT-guided biopsy of the left posterior iliac bone lesion.

## 2016-03-10 ENCOUNTER — Telehealth: Payer: Self-pay | Admitting: *Deleted

## 2016-03-10 NOTE — Telephone Encounter (Signed)
VM from wife states she is concerned pt is having a lot of headaches and neck pain.  She asks for nurse to call pt.. I called pt and he says he is fine and doesn't need anything.  He has not been seen in our office in over a year.  He is being followed at [facility].  He says he goes there once a month and he doesn't need anything from Dr. Alvy Bimler at this time.

## 2016-06-01 IMAGING — RF DG FEMUR 2+V*L*
1 series · 5 of 5 positions shown · non-contrast
Comparison: none

CLINICAL DATA: ORIF of the left femur

EXAM:
DG C-ARM 61-120 MIN; LEFT FEMUR 2 VIEWS
FLUOROSCOPY TIME:  Fluoroscopy Time (in minutes and seconds) as
reported by the performing provider: 3 minutes, 27 seconds
Number of Acquired Images:  5

[Series 1: run · 5 of 5 slices shown]
[im 1/5]
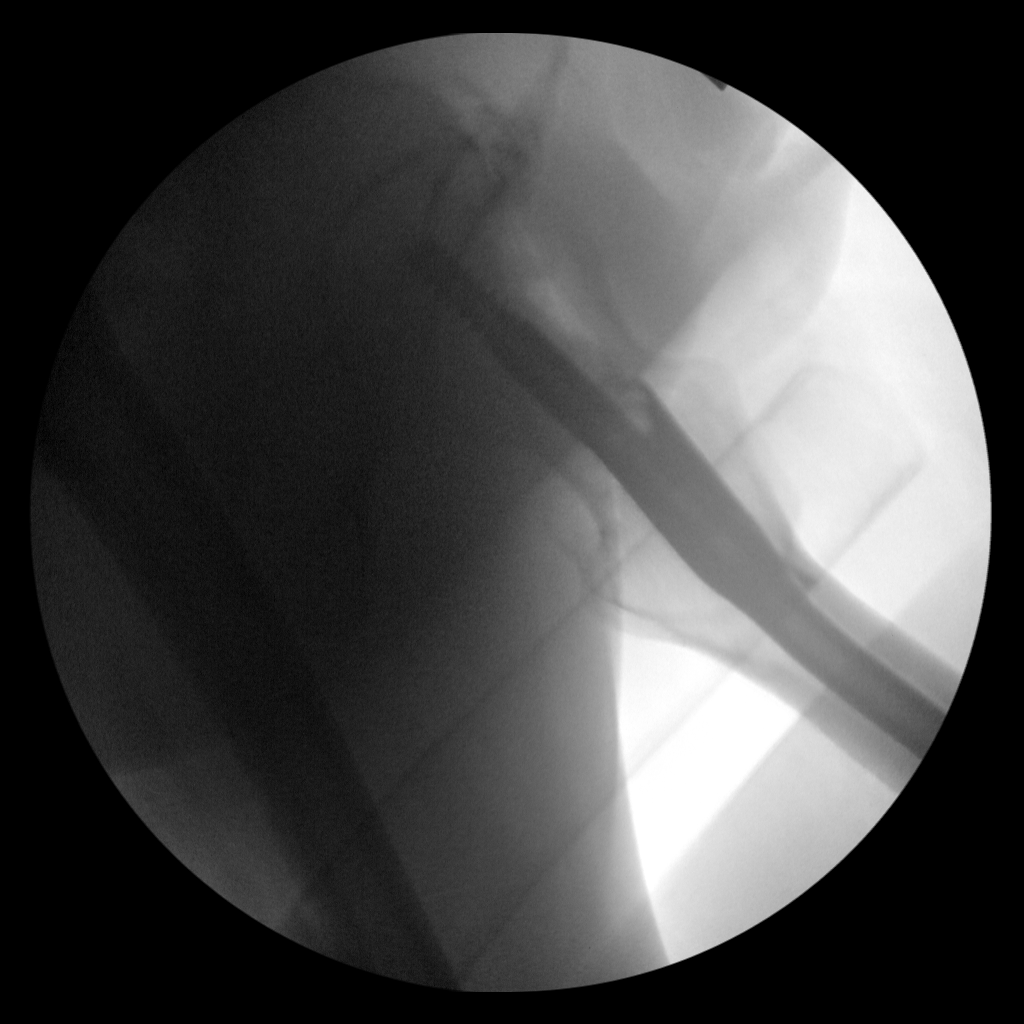
[im 2/5]
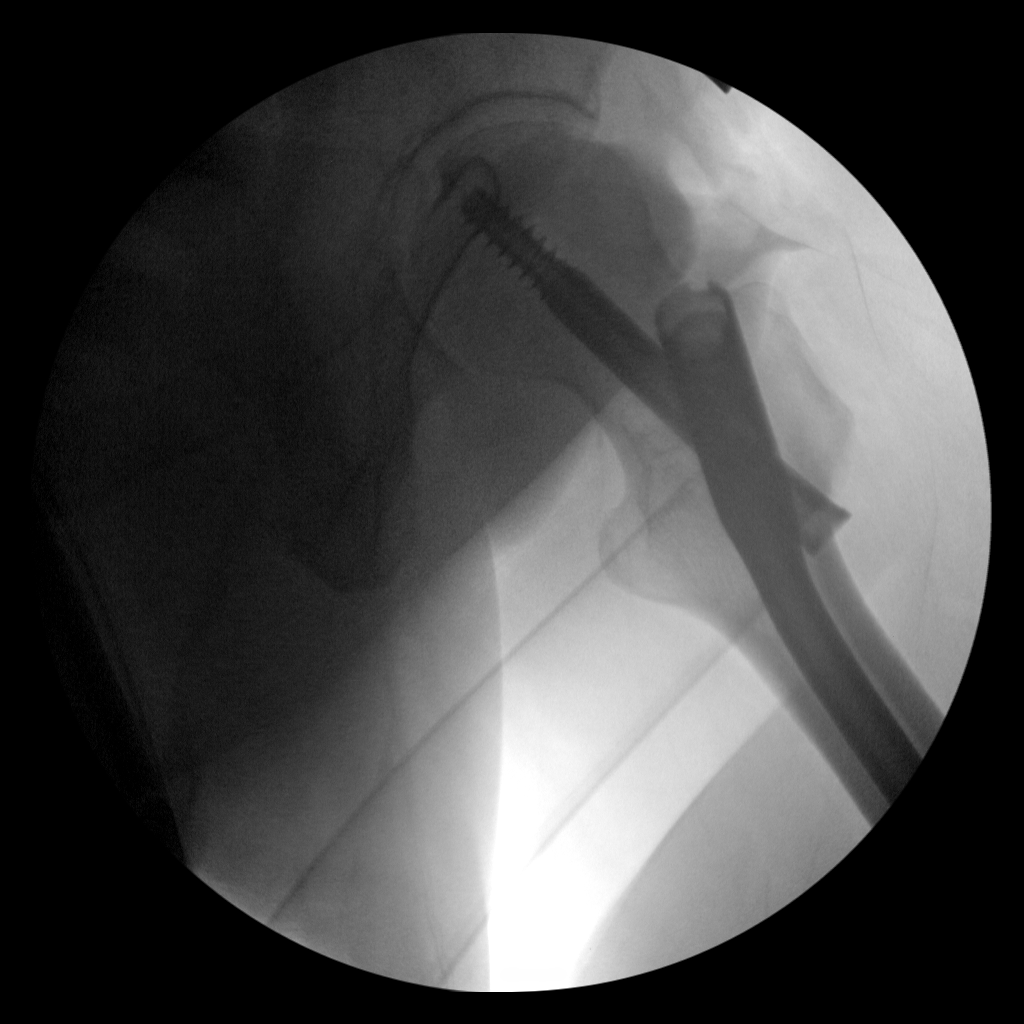
[im 3/5]
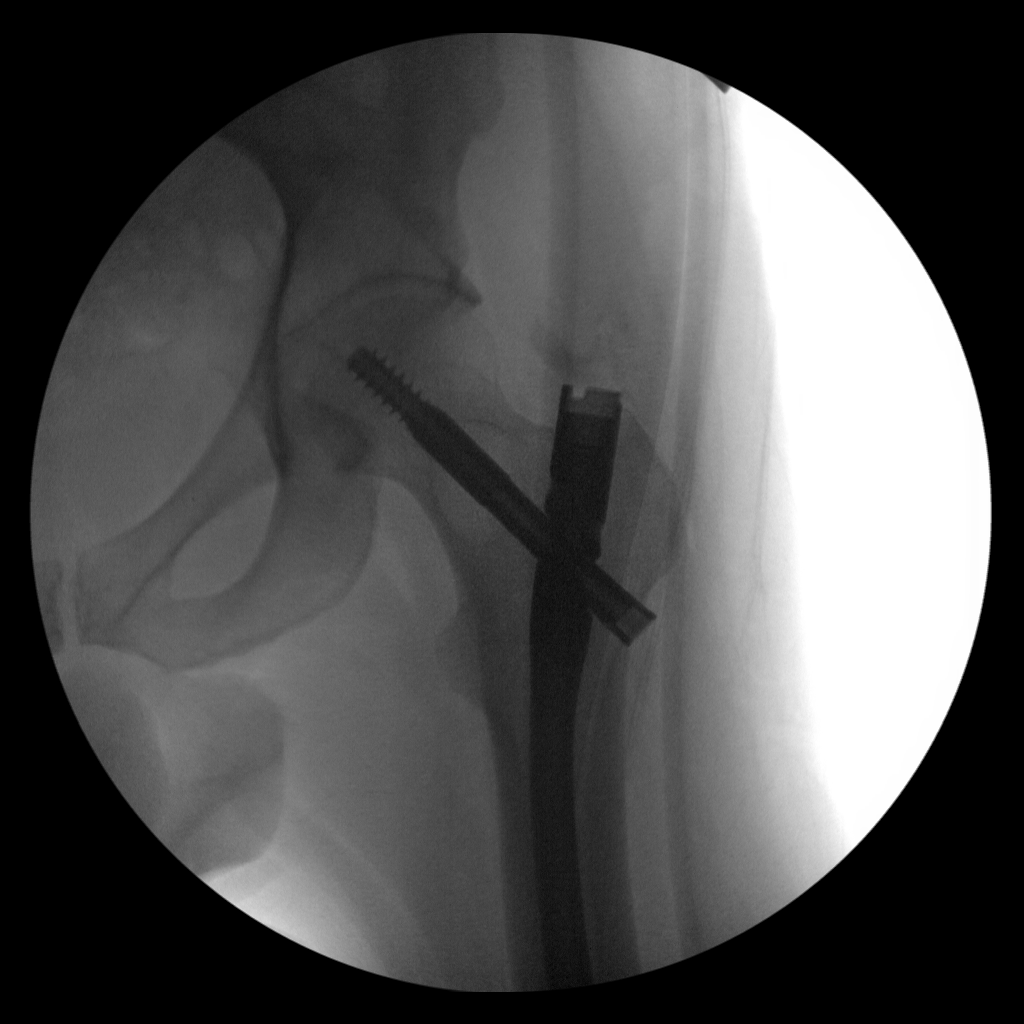
[im 4/5]
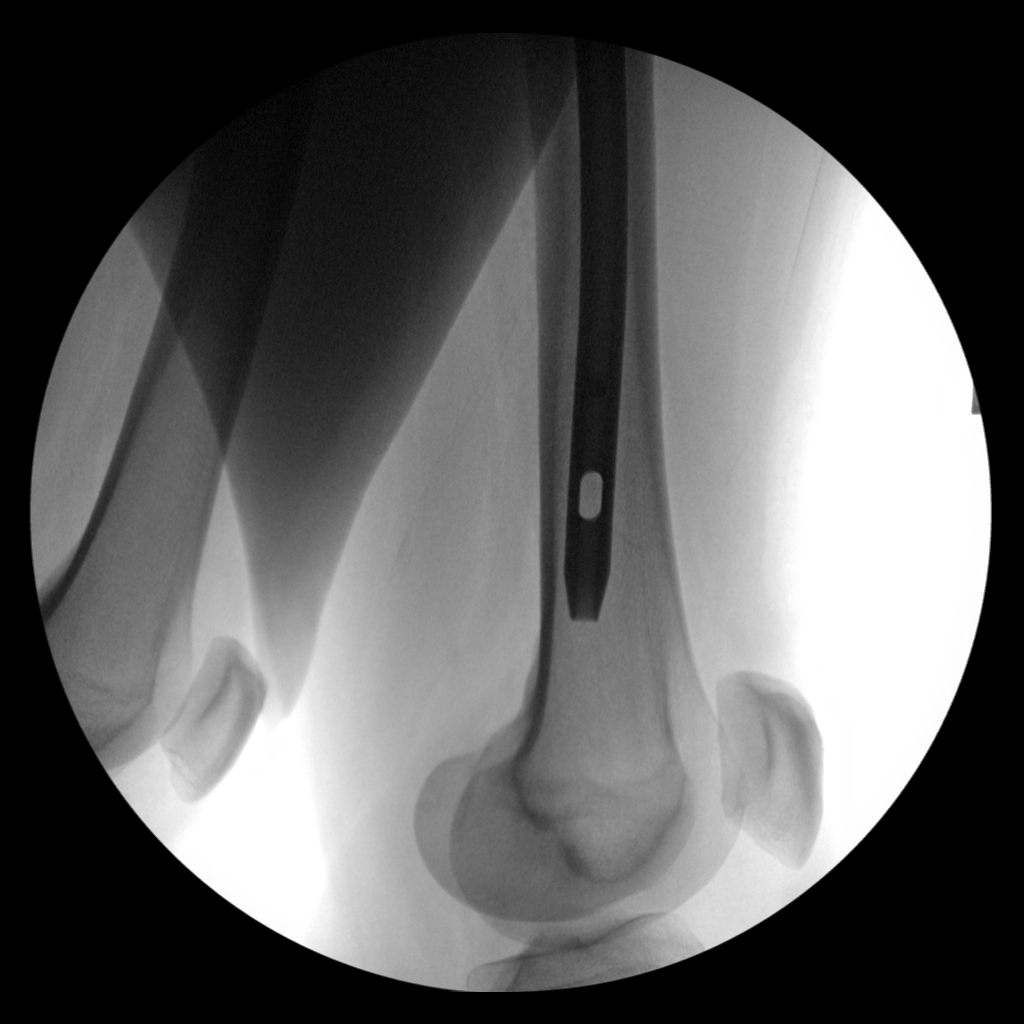
[im 5/5]
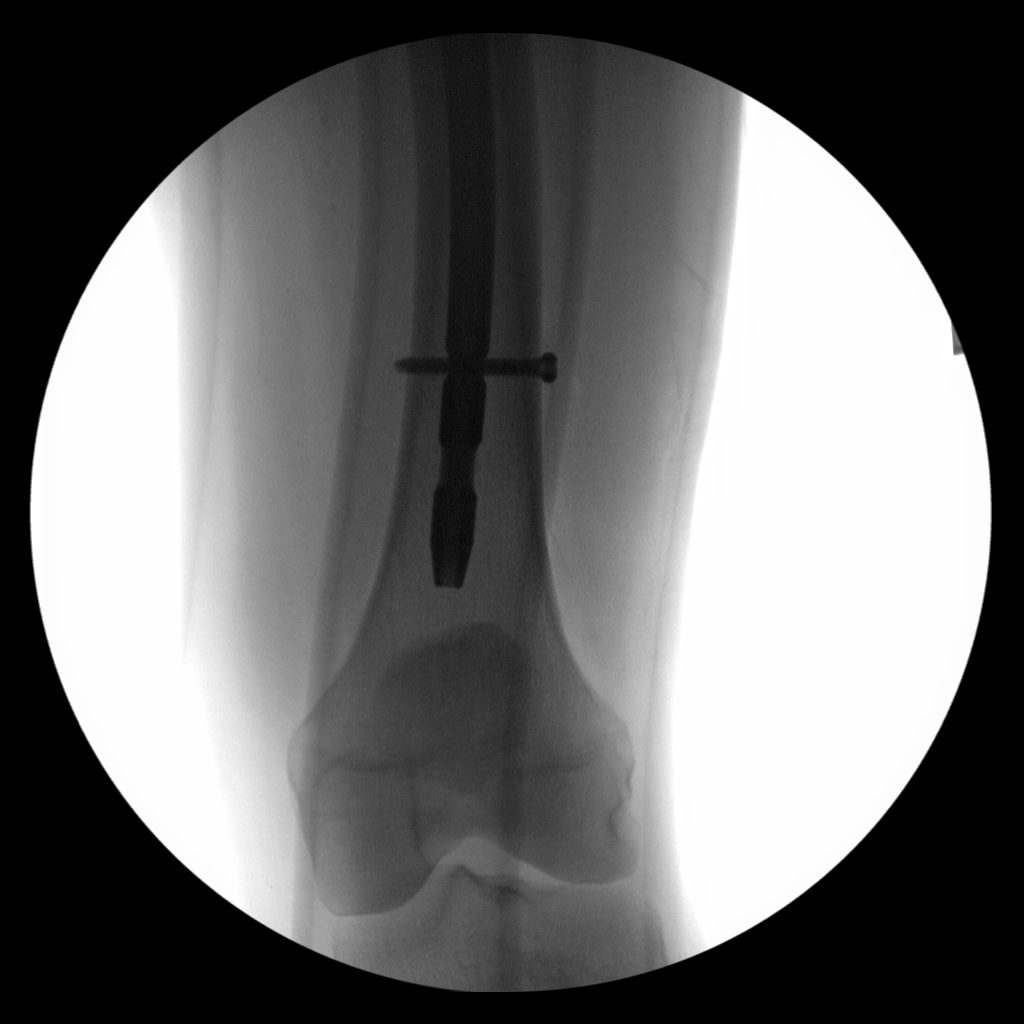

[5 of 5 positions shown; findings below may reference images not displayed]

FINDINGS: The fluoro spot films reveal the patient to of undergone placement
of an intra medullary rod with telescoping screw in the neck and
intertrochanteric region of the left femur. A lytic lesion in the
femoral neck is visible. There is no postprocedure complication.
IMPRESSION: The patient has undergone ORIF due to a lytic lesion in the left
femoral neck. There is no immediate postprocedure complication.
